# Patient Record
Sex: Female | Born: 1970 | Race: White | Hispanic: No | Marital: Married | Smoking: Never smoker
Health system: Southern US, Community
[De-identification: ages and names within clinical notes are randomized; demographics above are authoritative.]

## PROBLEM LIST (undated history)

## (undated) DIAGNOSIS — F259 Schizoaffective disorder, unspecified: Secondary | ICD-10-CM

## (undated) DIAGNOSIS — C4491 Basal cell carcinoma of skin, unspecified: Secondary | ICD-10-CM

## (undated) DIAGNOSIS — C801 Malignant (primary) neoplasm, unspecified: Secondary | ICD-10-CM

## (undated) DIAGNOSIS — G43909 Migraine, unspecified, not intractable, without status migrainosus: Secondary | ICD-10-CM

## (undated) HISTORY — PX: BASAL CELL CARCINOMA EXCISION: SHX1214

## (undated) HISTORY — DX: Basal cell carcinoma of skin, unspecified: C44.91

## (undated) HISTORY — DX: Migraine, unspecified, not intractable, without status migrainosus: G43.909

---

## 2010-01-19 HISTORY — PX: MOHS SURGERY: SHX181

## 2011-05-26 ENCOUNTER — Ambulatory Visit: Payer: Self-pay | Admitting: Internal Medicine

## 2011-05-28 LAB — HM MAMMOGRAPHY: HM MAMMO: NORMAL

## 2011-09-20 LAB — HM PAP SMEAR: HM Pap smear: NORMAL

## 2014-07-16 ENCOUNTER — Ambulatory Visit (INDEPENDENT_AMBULATORY_CARE_PROVIDER_SITE_OTHER): Payer: BLUE CROSS/BLUE SHIELD | Admitting: Internal Medicine

## 2014-07-16 ENCOUNTER — Encounter (INDEPENDENT_AMBULATORY_CARE_PROVIDER_SITE_OTHER): Payer: Self-pay

## 2014-07-16 ENCOUNTER — Encounter: Payer: Self-pay | Admitting: Internal Medicine

## 2014-07-16 VITALS — BP 110/70 | HR 72 | Temp 98.4°F | Ht 69.0 in | Wt 210.8 lb

## 2014-07-16 DIAGNOSIS — N760 Acute vaginitis: Secondary | ICD-10-CM | POA: Insufficient documentation

## 2014-07-16 DIAGNOSIS — H6502 Acute serous otitis media, left ear: Secondary | ICD-10-CM | POA: Diagnosis not present

## 2014-07-16 DIAGNOSIS — F259 Schizoaffective disorder, unspecified: Secondary | ICD-10-CM | POA: Insufficient documentation

## 2014-07-16 DIAGNOSIS — E785 Hyperlipidemia, unspecified: Secondary | ICD-10-CM | POA: Insufficient documentation

## 2014-07-16 DIAGNOSIS — J40 Bronchitis, not specified as acute or chronic: Secondary | ICD-10-CM | POA: Diagnosis not present

## 2014-07-16 DIAGNOSIS — Z79899 Other long term (current) drug therapy: Secondary | ICD-10-CM | POA: Insufficient documentation

## 2014-07-16 DIAGNOSIS — G43009 Migraine without aura, not intractable, without status migrainosus: Secondary | ICD-10-CM | POA: Insufficient documentation

## 2014-07-16 DIAGNOSIS — F258 Other schizoaffective disorders: Secondary | ICD-10-CM

## 2014-07-16 MED ORDER — AMOXICILLIN-POT CLAVULANATE 875-125 MG PO TABS: 1.0000 | ORAL_TABLET | Freq: Two times a day (BID) | ORAL | Status: DC

## 2014-07-16 NOTE — Patient Instructions (Signed)
Use Nasacort nasal spray and Advil for ear pain May use over the counter cold medication if helpful

## 2014-07-16 NOTE — Progress Notes (Signed)
Date:  07/16/2014   Name:  Beth Lewis   DOB:  [birth year]   MRN:  [id number]   Chief Complaint: Otalgia and Cough Otalgia  There is pain in both ears. This is a new problem. The current episode started 1 to 4 weeks ago. The problem occurs constantly. The problem has been gradually improving. There has been no fever. Associated symptoms include coughing and a sore throat. Pertinent negatives include no ear discharge or headaches. She has tried acetaminophen for the symptoms. The treatment provided mild relief.  Cough Associated symptoms include ear pain and a sore throat. Pertinent negatives include no chest pain, chills, fever, headaches or postnasal drip.     Review of Systems:  Review of Systems  Constitutional: Negative for fever and chills.  HENT: Positive for ear pain and sore throat. Negative for ear discharge, postnasal drip and sinus pressure.   Respiratory: Positive for cough. Negative for chest tightness.   Cardiovascular: Negative for chest pain and palpitations.  Neurological: Negative for headaches.    Patient Active Problem List   Diagnosis Date Noted  . Acute vaginitis 07/16/2014  . Dyslipidemia 07/16/2014  . Polypharmacy 07/16/2014  . Headache, menstrual migraine 07/16/2014  . Chronic schizoaffective disorder 07/16/2014    Prior to Admission medications   Medication Sig Start Date End Date Taking? Authorizing Provider  divalproex (DEPAKOTE ER) 500 MG 24 hr tablet Take 3 tablets by mouth daily. 12/26/13  Yes Historical Provider, MD  norgestrel-ethinyl estradiol (CRYSELLE-28) 0.3-30 MG-MCG tablet Take 1 tablet by mouth daily. 01/08/14  Yes Historical Provider, MD  risperiDONE (RISPERDAL) 1 MG tablet Take 1 tablet by mouth at bedtime. 04/10/14  Yes Historical Provider, MD  zolmitriptan (ZOMIG-ZMT) 5 MG disintegrating tablet Take 1 tablet by mouth daily. 12/16/13  Yes Historical Provider, MD    No Known Allergies  Past Surgical History  Procedure Laterality  Date  . Mohs surgery  2012    History  Substance Use Topics  . Smoking status: Never Smoker   . Smokeless tobacco: Not on file  . Alcohol Use: 0.0 oz/week    0 Standard drinks or equivalent per week     Medication list has been reviewed and updated.  Physical Examination:  Physical Exam  Constitutional: She appears well-developed and well-nourished.  HENT:  Head: Normocephalic.  Right Ear: Hearing and external ear normal. Tympanic membrane is erythematous and retracted.  Left Ear: Hearing normal. Tympanic membrane is erythematous and retracted. A middle ear effusion is present.  Nose: Right sinus exhibits no maxillary sinus tenderness. Left sinus exhibits no maxillary sinus tenderness.  Mouth/Throat: Uvula is midline. Posterior oropharyngeal erythema present. No posterior oropharyngeal edema.  Eyes: Pupils are equal, round, and reactive to light.  Pulmonary/Chest: Effort normal and breath sounds normal. No respiratory distress. She has no wheezes.  Nursing note and vitals reviewed.   BP 110/70 mmHg  Pulse 72  Temp(Src) 98.4 F (36.9 C)  Ht 5\' 9"  (1.753 m)  Wt 210 lb 12.8 oz (95.618 kg)  BMI 31.12 kg/m2  SpO2 98%  Assessment and Plan: 1. Bronchitis Continue Thera-flu if needed - amoxicillin-clavulanate (AUGMENTIN) 875-125 MG per tablet; Take 1 tablet by mouth 2 (two) times daily.  Dispense: 20 tablet; Refill: 0  2. Acute serous otitis media of left ear, recurrence not specified Advil for ear pain - amoxicillin-clavulanate (AUGMENTIN) 875-125 MG per tablet; Take 1 tablet by mouth 2 (two) times daily.  Dispense: 20 tablet; Refill: 0   Halina Maidens, MD [facility]  [facility]  07/16/2014

## 2014-10-01 ENCOUNTER — Other Ambulatory Visit: Payer: Self-pay | Admitting: Internal Medicine

## 2014-11-07 ENCOUNTER — Ambulatory Visit (INDEPENDENT_AMBULATORY_CARE_PROVIDER_SITE_OTHER): Payer: BLUE CROSS/BLUE SHIELD | Admitting: Internal Medicine

## 2014-11-07 ENCOUNTER — Encounter: Payer: Self-pay | Admitting: Internal Medicine

## 2014-11-07 VITALS — BP 100/64 | HR 76 | Ht 69.0 in | Wt 206.6 lb

## 2014-11-07 DIAGNOSIS — B3731 Acute candidiasis of vulva and vagina: Secondary | ICD-10-CM

## 2014-11-07 DIAGNOSIS — R300 Dysuria: Secondary | ICD-10-CM | POA: Diagnosis not present

## 2014-11-07 DIAGNOSIS — B373 Candidiasis of vulva and vagina: Secondary | ICD-10-CM

## 2014-11-07 LAB — POCT URINALYSIS DIPSTICK
BILIRUBIN UA: NEGATIVE
Glucose, UA: NEGATIVE
Ketones, UA: NEGATIVE
NITRITE UA: NEGATIVE
PH UA: 6
Protein, UA: NEGATIVE
Spec Grav, UA: 1.01
UROBILINOGEN UA: 0.2

## 2014-11-07 MED ORDER — CIPROFLOXACIN HCL 250 MG PO TABS: 250.0000 mg | ORAL_TABLET | Freq: Two times a day (BID) | ORAL | Status: DC

## 2014-11-07 MED ORDER — FLUCONAZOLE 100 MG PO TABS: 100.0000 mg | ORAL_TABLET | Freq: Every day | ORAL | Status: DC

## 2014-11-07 NOTE — Progress Notes (Signed)
Date:  11/07/2014   Name:  Beth Lewis   DOB:  [birth year]   MRN:  [id number]   Chief Complaint: Urinary Tract Infection and Vaginitis Patient states she had onset last week of vaginal discharge and itching that began after intercourse. She started using over-the-counter Monistat cream with minimal improvement. I will days ago began to have urinary frequency and urgency as well as mild dysuria. She also had cloudy urine without blood or odor. She denies nausea vomiting fever chills or flank pain. Patient also complains of 2 episodes of menstrual bleeding that occurred midcycle. She is on oral contraceptives and takes them appropriately. She denies any pelvic pain or pain with intercourse. It's been 3 years since her last Pap smear and pelvic exam.  Review of Systems  Constitutional: Negative for fever, chills and fatigue.  Respiratory: Negative for chest tightness.   Cardiovascular: Negative for chest pain.  Gastrointestinal: Negative for nausea and diarrhea.  Genitourinary: Positive for dysuria, frequency, vaginal discharge (itching) and pelvic pain. Negative for hematuria.    Patient Active Problem List   Diagnosis Date Noted  . Acute vaginitis 07/16/2014  . Dyslipidemia 07/16/2014  . Polypharmacy 07/16/2014  . Headache, menstrual migraine 07/16/2014  . Chronic schizoaffective disorder ([place]) 07/16/2014    Prior to Admission medications   Medication Sig Start Date End Date Taking? Authorizing Provider  divalproex (DEPAKOTE ER) 500 MG 24 hr tablet Take 3 tablets by mouth daily. 12/26/13  Yes Historical Provider, MD  norgestrel-ethinyl estradiol (CRYSELLE-28) 0.3-30 MG-MCG tablet Take 1 tablet by mouth daily. 01/08/14  Yes Historical Provider, MD  risperiDONE (RISPERDAL) 1 MG tablet TAKE 1 TABLET BY MOUTH EVERY DAY AT BEDTIME 10/01/14  Yes [name], MD  zolmitriptan (ZOMIG-ZMT) 5 MG disintegrating tablet Take 1 tablet by mouth daily. 12/16/13  Yes Historical Provider, MD     No Known Allergies  Past Surgical History  Procedure Laterality Date  . Mohs surgery  2012    Social History  Substance Use Topics  . Smoking status: Never Smoker   . Smokeless tobacco: None  . Alcohol Use: 0.0 oz/week    0 Standard drinks or equivalent per week     Medication list has been reviewed and updated.   Physical Exam  Constitutional: She appears well-developed.  Cardiovascular: Normal rate and regular rhythm.   Pulmonary/Chest: Effort normal and breath sounds normal.  Abdominal: Soft. Bowel sounds are normal. She exhibits no mass. There is no tenderness. There is no guarding and no CVA tenderness.  Nursing note and vitals reviewed.   BP 100/64 mmHg  Pulse 76  Ht 5\' 9"  (1.753 m)  Wt 206 lb 9.6 oz (93.713 kg)  BMI 30.50 kg/m2  Assessment and Plan: 1. Dysuria Secondary to urinary tract infection - push fluids and empty bladder frequently - POCT urinalysis dipstick - ciprofloxacin (CIPRO) 250 MG tablet; Take 1 tablet (250 mg total) by mouth 2 (two) times daily.  Dispense: 14 tablet; Refill: 0  2. Yeast vaginitis Take Diflucan 1 tablet every other day 3 doses - fluconazole (DIFLUCAN) 100 MG tablet; Take 1 tablet (100 mg total) by mouth daily.  Dispense: 3 tablet; Refill: 0   Halina Maidens, MD [facility]  11/07/2014

## 2014-12-11 LAB — HM PAP SMEAR: HM PAP: NORMAL

## 2014-12-17 ENCOUNTER — Other Ambulatory Visit: Payer: Self-pay | Admitting: Internal Medicine

## 2015-02-05 ENCOUNTER — Other Ambulatory Visit: Payer: Self-pay | Admitting: Internal Medicine

## 2015-02-14 ENCOUNTER — Ambulatory Visit (INDEPENDENT_AMBULATORY_CARE_PROVIDER_SITE_OTHER): Payer: BLUE CROSS/BLUE SHIELD | Admitting: Internal Medicine

## 2015-02-14 ENCOUNTER — Encounter: Payer: Self-pay | Admitting: Internal Medicine

## 2015-02-14 ENCOUNTER — Ambulatory Visit
Admission: RE | Admit: 2015-02-14 | Discharge: 2015-02-14 | Disposition: A | Discharge status: Home / Self Care | Payer: BLUE CROSS/BLUE SHIELD | Source: Ambulatory Visit | Attending: Internal Medicine | Admitting: Internal Medicine

## 2015-02-14 ENCOUNTER — Other Ambulatory Visit: Payer: Self-pay | Admitting: Internal Medicine

## 2015-02-14 VITALS — BP 122/76 | HR 84 | Ht 69.0 in | Wt 205.4 lb

## 2015-02-14 DIAGNOSIS — N912 Amenorrhea, unspecified: Secondary | ICD-10-CM

## 2015-02-14 DIAGNOSIS — Z1231 Encounter for screening mammogram for malignant neoplasm of breast: Secondary | ICD-10-CM

## 2015-02-14 DIAGNOSIS — E785 Hyperlipidemia, unspecified: Secondary | ICD-10-CM

## 2015-02-14 DIAGNOSIS — Z79899 Other long term (current) drug therapy: Secondary | ICD-10-CM | POA: Diagnosis not present

## 2015-02-14 DIAGNOSIS — Z0000 Encounter for general adult medical examination without abnormal findings: Secondary | ICD-10-CM

## 2015-02-14 DIAGNOSIS — F258 Other schizoaffective disorders: Secondary | ICD-10-CM

## 2015-02-14 DIAGNOSIS — F259 Schizoaffective disorder, unspecified: Secondary | ICD-10-CM

## 2015-02-14 LAB — POCT URINALYSIS DIPSTICK
Bilirubin, UA: NEGATIVE
GLUCOSE UA: NEGATIVE
KETONES UA: NEGATIVE
Leukocytes, UA: NEGATIVE
Nitrite, UA: NEGATIVE
PROTEIN UA: NEGATIVE
RBC UA: NEGATIVE
SPEC GRAV UA: 1.015
Urobilinogen, UA: 0.2
pH, UA: 6

## 2015-02-14 NOTE — Progress Notes (Signed)
Date:  02/14/2015   Name:  Beth Lewis   DOB:  16-Jun-1970   MRN:  [id number]   Chief Complaint: Annual Exam Beth Lewis is a 45 y.o. female who presents today for her Complete Annual Exam. She feels fairly well. [facility] She reports she is sleeping fairly well. Seen last fall by GYN - had pap and breast exam. She is due for a mammogram. Amenorrhea - patient is 2 weeks late for her menstrual cycle. Her birth control pills were switched to a lower dose estrogen and a different progesterone; she started the new pack last month. After her period was several days late she did a home pregnancy which was positive, she did 2 more home tests which were both negative. She is concerned because she's also had nausea without vomiting. Migraine - migraine headaches are stable in frequency and severity. Last migraine was about 2-3 months ago. The headaches still respond to medication.  Schizoaffective disorder - she is doing well on medication; risperidone and valproate. Mood has been stable and she's had no recurrence of psychotic symptoms. She occasionally feels anxious this is usually due to a social stress or event.  Review of Systems  Constitutional: Negative for chills, diaphoresis, fatigue and unexpected weight change.  HENT: Negative for hearing loss, tinnitus and trouble swallowing.   Eyes: Negative for visual disturbance.  Respiratory: Negative for shortness of breath and wheezing.   Cardiovascular: Negative for chest pain, palpitations and leg swelling.  Gastrointestinal: Positive for nausea. Negative for vomiting.  Genitourinary: Negative for dysuria, hematuria and menstrual problem.  Musculoskeletal: Negative for myalgias, arthralgias and gait problem.  Skin: Negative for color change and rash.  Neurological: Positive for headaches (last migraine 2 months ago). Negative for syncope.  Psychiatric/Behavioral: Negative for hallucinations,  confusion, sleep disturbance, dysphoric mood and decreased concentration. The patient is not nervous/anxious.     Patient Active Problem List   Diagnosis Date Noted  . Acute vaginitis 07/16/2014  . Dyslipidemia 07/16/2014  . Polypharmacy 07/16/2014  . Headache, menstrual migraine 07/16/2014  . Chronic schizoaffective disorder ([place]) 07/16/2014    Prior to Admission medications   Medication Sig Start Date End Date Taking? Authorizing Provider  divalproex (DEPAKOTE ER) 500 MG 24 hr tablet TAKE 3 TABLETS BY MOUTH EVERY DAY 02/05/15  Yes [name], MD  LO LOESTRIN FE 1 MG-10 MCG / 10 MCG tablet Take 1 tablet by mouth daily. 01/23/15  Yes Historical Provider, MD  risperiDONE (RISPERDAL) 1 MG tablet TAKE 1 TABLET BY MOUTH EVERY DAY AT BEDTIME 10/01/14  Yes [name], MD  zolmitriptan (ZOMIG-ZMT) 5 MG disintegrating tablet Take 1 tablet by mouth daily. 12/16/13  Yes Historical Provider, MD    No Known Allergies  Past Surgical History  Procedure Laterality Date  . Mohs surgery  2012    Social History  Substance Use Topics  . Smoking status: Never Smoker   . Smokeless tobacco: None  . Alcohol Use: 0.0 oz/week    0 Standard drinks or equivalent per week     Comment: occasional    Medication list has been reviewed and updated.   Physical Exam  Constitutional: She is oriented to person, place, and time. She appears well-developed and well-nourished. No distress.  HENT:  Head: Normocephalic and atraumatic.  Right Ear: Tympanic membrane and ear canal normal.  Left Ear: Tympanic membrane and ear canal normal.  Nose: Right sinus exhibits no maxillary  sinus tenderness. Left sinus exhibits no maxillary sinus tenderness.  Mouth/Throat: Uvula is midline and oropharynx is clear and moist.  Eyes: Conjunctivae and EOM are normal. Right eye exhibits no discharge. Left eye exhibits no discharge. No scleral icterus.  Neck: Normal range of motion. Carotid bruit is not present. No  erythema present. No thyromegaly present.  Cardiovascular: Normal rate, regular rhythm, normal heart sounds and normal pulses.   Pulmonary/Chest: Effort normal. No respiratory distress. She has no wheezes.  Abdominal: Soft. Bowel sounds are normal.  Musculoskeletal: Normal range of motion.  Lymphadenopathy:    She has no cervical adenopathy.  Neurological: She is alert and oriented to person, place, and time. She has normal reflexes. No cranial nerve deficit or sensory deficit.  Skin: Skin is warm, dry and intact. No rash noted.  Psychiatric: She has a normal mood and affect. Her speech is normal and behavior is normal. Thought content normal.  Nursing note and vitals reviewed.   BP 122/76 mmHg  Pulse 84  Ht 5\' 9"  (1.753 m)  Wt 205 lb 6.4 oz (93.169 kg)  BMI 30.32 kg/m2  LMP 12/18/2014  Assessment and Plan: 1. Annual physical exam Pap done by GYN Pt encouraged to schedule mammogram - POCT Urinalysis Dipstick - CBC with Differential/Platelet - Comprehensive metabolic panel - TSH  2. Dyslipidemia - Lipid panel  3. Chronic schizoaffective disorder (HCC) Stable on Risperdone and Depakote  4. Amenorrhea Likely due to change in OC formulation If nausea and amenorrhea continue patient instructed to contact GYN - hCG, serum, qualitative  5. Long term use of drug - Valproic Acid level   Halina Maidens, MD [facility]  02/14/2015

## 2015-02-14 NOTE — Patient Instructions (Signed)
Breast Self-Awareness Practicing breast self-awareness may pick up problems early, prevent significant medical complications, and possibly save your life. By practicing breast self-awareness, you can become familiar with how your breasts look and feel and if your breasts are changing. This allows you to notice changes early. It can also offer you some reassurance that your breast health is good. One way to learn what is normal for your breasts and whether your breasts are changing is to do a breast self-exam. If you find a lump or something that was not present in the past, it is best to contact your caregiver right away. Other findings that should be evaluated by your caregiver include nipple discharge, especially if it is bloody; skin changes or reddening; areas where the skin seems to be pulled in (retracted); or new lumps and bumps. Breast pain is seldom associated with cancer (malignancy), but should also be evaluated by a caregiver. HOW TO PERFORM A BREAST SELF-EXAM The best time to examine your breasts is 5-7 days after your menstrual period is over. During menstruation, the breasts are lumpier, and it may be more difficult to pick up changes. If you do not menstruate, have reached menopause, or had your uterus removed (hysterectomy), you should examine your breasts at regular intervals, such as monthly. If you are breastfeeding, examine your breasts after a feeding or after using a breast pump. Breast implants do not decrease the risk for lumps or tumors, so continue to perform breast self-exams as recommended. Talk to your caregiver about how to determine the difference between the implant and breast tissue. Also, talk about the amount of pressure you should use during the exam. Over time, you will become more familiar with the variations of your breasts and more comfortable with the exam. A breast self-exam requires you to remove all your clothes above the waist. 1. Look at your breasts and nipples.  Stand in front of a mirror in a room with good lighting. With your hands on your hips, push your hands firmly downward. Look for a difference in shape, contour, and size from one breast to the other (asymmetry). Asymmetry includes puckers, dips, or bumps. Also, look for skin changes, such as reddened or scaly areas on the breasts. Look for nipple changes, such as discharge, dimpling, repositioning, or redness. 2. Carefully feel your breasts. This is best done either in the shower or tub while using soapy water or when flat on your back. Place the arm (on the side of the breast you are examining) above your head. Use the pads (not the fingertips) of your three middle fingers on your opposite hand to feel your breasts. Start in the underarm area and use  inch (2 cm) overlapping circles to feel your breast. Use 3 different levels of pressure (light, medium, and firm pressure) at each circle before moving to the next circle. The light pressure is needed to feel the tissue closest to the skin. The medium pressure will help to feel breast tissue a little deeper, while the firm pressure is needed to feel the tissue close to the ribs. Continue the overlapping circles, moving downward over the breast until you feel your ribs below your breast. Then, move one finger-width towards the center of the body. Continue to use the  inch (2 cm) overlapping circles to feel your breast as you move slowly up toward the collar bone (clavicle) near the base of the neck. Continue the up and down exam using all 3 pressures until you reach the   middle of the chest. Do this with each breast, carefully feeling for lumps or changes. 3.  Keep a written record with breast changes or normal findings for each breast. By writing this information down, you do not need to depend only on memory for size, tenderness, or location. Write down where you are in your menstrual cycle, if you are still menstruating. Breast tissue can have some lumps or  thick tissue. However, see your caregiver if you find anything that concerns you.  SEEK MEDICAL CARE IF:  You see a change in shape, contour, or size of your breasts or nipples.   You see skin changes, such as reddened or scaly areas on the breasts or nipples.   You have an unusual discharge from your nipples.   You feel a new lump or unusually thick areas.    This information is not intended to replace advice given to you by your health care provider. Make sure you discuss any questions you have with your health care provider.   Document Released: 01/05/2005 Document Revised: 12/23/2011 Document Reviewed: 04/22/2011 Elsevier Interactive Patient Education 2016 [employer] Inc[employer]  

## 2015-02-15 ENCOUNTER — Telehealth: Payer: Self-pay

## 2015-02-15 ENCOUNTER — Ambulatory Visit
Admission: RE | Admit: 2015-02-15 | Discharge: 2015-02-15 | Disposition: A | Discharge status: Home / Self Care | Payer: BLUE CROSS/BLUE SHIELD | Source: Ambulatory Visit | Attending: Internal Medicine | Admitting: Internal Medicine

## 2015-02-15 DIAGNOSIS — Z1231 Encounter for screening mammogram for malignant neoplasm of breast: Secondary | ICD-10-CM | POA: Diagnosis present

## 2015-02-15 HISTORY — DX: Malignant (primary) neoplasm, unspecified: C80.1

## 2015-02-15 LAB — CBC WITH DIFFERENTIAL/PLATELET
BASOS ABS: 0 10*3/uL (ref 0.0–0.2)
Basos: 0 %
EOS (ABSOLUTE): 0.1 10*3/uL (ref 0.0–0.4)
Eos: 1 %
Hematocrit: 42.5 % (ref 34.0–46.6)
Hemoglobin: 14.5 g/dL (ref 11.1–15.9)
IMMATURE GRANS (ABS): 0 10*3/uL (ref 0.0–0.1)
Immature Granulocytes: 0 %
LYMPHS ABS: 2.8 10*3/uL (ref 0.7–3.1)
LYMPHS: 31 %
MCH: 31.7 pg (ref 26.6–33.0)
MCHC: 34.1 g/dL (ref 31.5–35.7)
MCV: 93 fL (ref 79–97)
Monocytes Absolute: 0.4 10*3/uL (ref 0.1–0.9)
Monocytes: 5 %
NEUTROS ABS: 5.6 10*3/uL (ref 1.4–7.0)
Neutrophils: 63 %
PLATELETS: 363 10*3/uL (ref 150–379)
RBC: 4.57 x10E6/uL (ref 3.77–5.28)
RDW: 13.2 % (ref 12.3–15.4)
WBC: 8.9 10*3/uL (ref 3.4–10.8)

## 2015-02-15 LAB — COMPREHENSIVE METABOLIC PANEL
ALK PHOS: 79 IU/L (ref 39–117)
ALT: 15 IU/L (ref 0–32)
AST: 15 IU/L (ref 0–40)
Albumin/Globulin Ratio: 1.6 (ref 1.1–2.5)
Albumin: 4.5 g/dL (ref 3.5–5.5)
BILIRUBIN TOTAL: 0.4 mg/dL (ref 0.0–1.2)
BUN/Creatinine Ratio: 17 (ref 9–23)
BUN: 12 mg/dL (ref 6–24)
CHLORIDE: 96 mmol/L (ref 96–106)
CO2: 20 mmol/L (ref 18–29)
Calcium: 9.8 mg/dL (ref 8.7–10.2)
Creatinine, Ser: 0.72 mg/dL (ref 0.57–1.00)
GFR calc Af Amer: 119 mL/min/{1.73_m2} (ref >59)
GFR calc non Af Amer: 103 mL/min/{1.73_m2} (ref >59)
GLUCOSE: 74 mg/dL (ref 65–99)
Globulin, Total: 2.9 g/dL (ref 1.5–4.5)
Potassium: 4.5 mmol/L (ref 3.5–5.2)
Sodium: 137 mmol/L (ref 134–144)
Total Protein: 7.4 g/dL (ref 6.0–8.5)

## 2015-02-15 LAB — VALPROIC ACID LEVEL: Valproic Acid Lvl: 41 ug/mL — ABNORMAL LOW (ref 50–100)

## 2015-02-15 LAB — LIPID PANEL
CHOLESTEROL TOTAL: 176 mg/dL (ref 100–199)
Chol/HDL Ratio: 3.6 ratio units (ref 0.0–4.4)
HDL: 49 mg/dL (ref >39)
LDL Calculated: 101 mg/dL — ABNORMAL HIGH (ref 0–99)
Triglycerides: 129 mg/dL (ref 0–149)
VLDL CHOLESTEROL CAL: 26 mg/dL (ref 5–40)

## 2015-02-15 LAB — TSH: TSH: 2.63 u[IU]/mL (ref 0.450–4.500)

## 2015-02-15 LAB — HCG, SERUM, QUALITATIVE: HCG, BETA SUBUNIT, QUAL, SERUM: NEGATIVE m[IU]/mL (ref <6)

## 2015-02-15 NOTE — Telephone Encounter (Signed)
Spoke with patient. Patient advised of all results and verbalized understanding. Will call back with any future questions or concerns. MAH  

## 2015-02-15 NOTE — Telephone Encounter (Signed)
-----   Message from [name], MD sent at 02/15/2015  7:49 AM EST ----- Pregnancy test is negative.  Other labs are normal.

## 2015-03-17 ENCOUNTER — Other Ambulatory Visit: Payer: Self-pay | Admitting: Internal Medicine

## 2015-04-02 ENCOUNTER — Ambulatory Visit (INDEPENDENT_AMBULATORY_CARE_PROVIDER_SITE_OTHER): Payer: BLUE CROSS/BLUE SHIELD | Admitting: Internal Medicine

## 2015-04-02 ENCOUNTER — Encounter: Payer: Self-pay | Admitting: Internal Medicine

## 2015-04-02 ENCOUNTER — Other Ambulatory Visit: Payer: Self-pay | Admitting: Internal Medicine

## 2015-04-02 VITALS — BP 124/78 | HR 98 | Temp 97.7°F | Ht 69.0 in | Wt 207.0 lb

## 2015-04-02 DIAGNOSIS — J40 Bronchitis, not specified as acute or chronic: Secondary | ICD-10-CM | POA: Diagnosis not present

## 2015-04-02 MED ORDER — LEVOFLOXACIN 500 MG PO TABS: 500.0000 mg | ORAL_TABLET | Freq: Every day | ORAL | Status: DC

## 2015-04-02 NOTE — Patient Instructions (Signed)

## 2015-04-02 NOTE — Progress Notes (Signed)
    Date:  04/02/2015   Name:  Beth Lewis   DOB:  1970-08-24   MRN:  [id number]   Chief Complaint: Cough Cough This is a new problem. The current episode started 1 to 4 weeks ago. The problem has been unchanged. The problem occurs hourly. The cough is productive of sputum (mostly yellow in the AM). Associated symptoms include chills and shortness of breath. Pertinent negatives include no chest pain, ear pain, fever, headaches, heartburn, postnasal drip, sore throat or wheezing. She has tried OTC cough suppressant for the symptoms.    Review of Systems  Constitutional: Positive for chills. Negative for fever and fatigue.  HENT: Negative for ear pain, hearing loss, postnasal drip and sore throat.   Respiratory: Positive for cough and shortness of breath. Negative for choking, chest tightness and wheezing.   Cardiovascular: Negative for chest pain and palpitations.  Gastrointestinal: Negative for heartburn, nausea, vomiting and abdominal pain.  Neurological: Negative for dizziness, syncope and headaches.    Patient Active Problem List   Diagnosis Date Noted  . Dyslipidemia 07/16/2014  . Headache, menstrual migraine 07/16/2014  . Chronic schizoaffective disorder ([place]) 07/16/2014    Prior to Admission medications   Medication Sig Start Date End Date Taking? Authorizing Provider  divalproex (DEPAKOTE ER) 500 MG 24 hr tablet TAKE 3 TABLETS BY MOUTH EVERY DAY 02/05/15  Yes [name], MD  JUNEL FE 1/20 1-20 MG-MCG tablet Take 1 tablet by mouth daily. 03/31/15  Yes Historical Provider, MD  risperiDONE (RISPERDAL) 1 MG tablet TAKE 1 TABLET BY MOUTH EVERY DAY AT BEDTIME 03/17/15  Yes [name], MD  zolmitriptan (ZOMIG-ZMT) 5 MG disintegrating tablet Take 1 tablet by mouth daily. 12/16/13  Yes Historical Provider, MD    No Known Allergies  Past Surgical History  Procedure Laterality Date  . Mohs surgery  2012    Social History  Substance Use Topics  . Smoking status:  Never Smoker   . Smokeless tobacco: None  . Alcohol Use: 0.0 oz/week    0 Standard drinks or equivalent per week     Comment: occasional    Medication list has been reviewed and updated.   Physical Exam  Constitutional: She is oriented to person, place, and time. She appears well-developed. No distress.  HENT:  Head: Normocephalic and atraumatic.  Right Ear: Tympanic membrane and ear canal normal.  Left Ear: Tympanic membrane and ear canal normal.  Mouth/Throat: No posterior oropharyngeal erythema.  Cardiovascular: Normal rate, regular rhythm and normal heart sounds.   Pulmonary/Chest: Effort normal. No respiratory distress. She has decreased breath sounds. She has no wheezes. She has no rales.  Musculoskeletal: Normal range of motion.  Neurological: She is alert and oriented to person, place, and time.  Skin: Skin is warm and dry. No rash noted.  Psychiatric: She has a normal mood and affect. Her behavior is normal. Thought content normal.    BP 124/78 mmHg  Pulse 98  Temp(Src) 97.7 F (36.5 C) (Oral)  Ht 5\' 9"  (1.753 m)  Wt 207 lb (93.895 kg)  BMI 30.55 kg/m2  SpO2 97%  Assessment and Plan: 1. Bronchitis Continue Daquil and fluids - levofloxacin (LEVAQUIN) 500 MG tablet; Take 1 tablet (500 mg total) by mouth daily.  Dispense: 7 tablet; Refill: 0   Halina Maidens, MD [facility]  04/02/2015

## 2015-05-02 ENCOUNTER — Other Ambulatory Visit: Payer: Self-pay | Admitting: Internal Medicine

## 2015-05-23 ENCOUNTER — Ambulatory Visit (INDEPENDENT_AMBULATORY_CARE_PROVIDER_SITE_OTHER): Payer: BLUE CROSS/BLUE SHIELD | Admitting: Internal Medicine

## 2015-05-23 ENCOUNTER — Encounter: Payer: Self-pay | Admitting: Internal Medicine

## 2015-05-23 VITALS — BP 118/70 | HR 70 | Temp 97.6°F | Wt 206.0 lb

## 2015-05-23 DIAGNOSIS — G43009 Migraine without aura, not intractable, without status migrainosus: Secondary | ICD-10-CM | POA: Diagnosis not present

## 2015-05-23 MED ORDER — NORGESTREL-ETHINYL ESTRADIOL 0.3-30 MG-MCG PO TABS: 1.0000 | ORAL_TABLET | Freq: Every day | ORAL | Status: DC

## 2015-05-23 NOTE — Progress Notes (Signed)
Date:  05/23/2015   Name:  Beth Lewis   DOB:  1970-09-21   MRN:  [id number]   Chief Complaint: Migraine Migraine  This is a recurrent problem. The problem occurs intermittently. The problem has been unchanged. The pain is located in the bilateral region. The pain quality is similar to prior headaches. Associated symptoms include nausea and vomiting. The symptoms are aggravated by OPCs. She has tried triptans for the symptoms.  She been having some spotting with her previous birth control pill-Cryselle. Her GYN changed her to a lower dose pill for the past 2 months she's been having a migraine every other day. Migraine still respond to Zomig but she is not pleased with the side effect.   Review of Systems  Constitutional: Negative for chills and fatigue.  Respiratory: Negative for chest tightness and shortness of breath.   Cardiovascular: Negative for chest pain and leg swelling.  Gastrointestinal: Positive for nausea and vomiting.  Genitourinary: Negative for menstrual problem and pelvic pain.  Neurological: Positive for headaches.    Patient Active Problem List   Diagnosis Date Noted  . Dyslipidemia 07/16/2014  . Headache, menstrual migraine 07/16/2014  . Chronic schizoaffective disorder ([place]) 07/16/2014    Prior to Admission medications   Medication Sig Start Date End Date Taking? Authorizing Provider  divalproex (DEPAKOTE ER) 500 MG 24 hr tablet TAKE 3 TABLETS BY MOUTH EVERY DAY 02/05/15  Yes [name], MD  JUNEL FE 1/20 1-20 MG-MCG tablet Take 1 tablet by mouth daily. 03/31/15  Yes Historical Provider, MD  risperiDONE (RISPERDAL) 1 MG tablet TAKE 1 TABLET BY MOUTH EVERY DAY AT BEDTIME 03/17/15  Yes [name], MD  zolmitriptan (ZOMIG-ZMT) 5 MG disintegrating tablet TAKE 1 TABLET BY MOUTH DAILY AS NEEDED 05/02/15  Yes [name], MD    No Known Allergies  Past Surgical History  Procedure Laterality Date  . Mohs surgery  2012    Social History    Substance Use Topics  . Smoking status: Never Smoker   . Smokeless tobacco: Never Used  . Alcohol Use: 0.0 oz/week    0 Standard drinks or equivalent per week     Comment: occasional     Medication list has been reviewed and updated.   Physical Exam  Constitutional: She is oriented to person, place, and time. She appears well-developed. No distress.  HENT:  Head: Normocephalic and atraumatic.  Right Ear: Tympanic membrane and ear canal normal.  Left Ear: Tympanic membrane and ear canal normal.  Mouth/Throat: Oropharynx is clear and moist.  Eyes: EOM are normal. Pupils are equal, round, and reactive to light.  Neck: Normal range of motion.  Cardiovascular: Normal rate, regular rhythm and normal heart sounds.   Pulmonary/Chest: Effort normal and breath sounds normal. No respiratory distress.  Musculoskeletal: She exhibits no edema.  Neurological: She is alert and oriented to person, place, and time. She has normal reflexes. Gait normal.  Reflex Scores:      Patellar reflexes are 2+ on the right side and 2+ on the left side. Skin: Skin is warm and dry. No rash noted.  Psychiatric: She has a normal mood and affect. Her behavior is normal. Thought content normal.  Nursing note and vitals reviewed.   BP 118/70 mmHg  Pulse 70  Temp(Src) 97.6 F (36.4 C)  Wt 206 lb (93.441 kg)  SpO2 99%  LMP 05/09/2015 (Approximate)  Assessment and Plan: 1. Migraine without aura and responsive to treatment Will stop new  OCP and resume Cryselle If headaches continue, consider starting a preventative therapy   Halina Maidens, MD [facility]  05/23/2015

## 2015-06-12 ENCOUNTER — Other Ambulatory Visit: Payer: Self-pay | Admitting: Internal Medicine

## 2015-09-07 ENCOUNTER — Other Ambulatory Visit: Payer: Self-pay | Admitting: Internal Medicine

## 2015-09-11 ENCOUNTER — Encounter: Payer: Self-pay | Admitting: Internal Medicine

## 2015-09-11 ENCOUNTER — Ambulatory Visit (INDEPENDENT_AMBULATORY_CARE_PROVIDER_SITE_OTHER): Payer: BLUE CROSS/BLUE SHIELD | Admitting: Internal Medicine

## 2015-09-11 VITALS — BP 107/76 | HR 76 | Temp 98.6°F | Resp 16 | Ht 69.0 in | Wt 207.2 lb

## 2015-09-11 DIAGNOSIS — B9689 Other specified bacterial agents as the cause of diseases classified elsewhere: Secondary | ICD-10-CM

## 2015-09-11 DIAGNOSIS — N3000 Acute cystitis without hematuria: Secondary | ICD-10-CM

## 2015-09-11 DIAGNOSIS — N760 Acute vaginitis: Secondary | ICD-10-CM | POA: Diagnosis not present

## 2015-09-11 DIAGNOSIS — A499 Bacterial infection, unspecified: Secondary | ICD-10-CM

## 2015-09-11 LAB — POC URINALYSIS WITH MICROSCOPIC (NON AUTO)MANUAL RESULT
BILIRUBIN UA: NEGATIVE
Crystals: 0
Epithelial cells, urine per micros: 5
GLUCOSE UA: NEGATIVE
KETONES UA: NEGATIVE
Mucus, UA: 0
NITRITE UA: POSITIVE
PROTEIN UA: NEGATIVE
RBC: 15 M/uL — AB (ref 4.04–5.48)
pH, UA: 5

## 2015-09-11 MED ORDER — CIPROFLOXACIN HCL 250 MG PO TABS: 250.0000 mg | ORAL_TABLET | Freq: Two times a day (BID) | ORAL | 0 refills | Status: DC

## 2015-09-11 MED ORDER — METRONIDAZOLE 500 MG PO TABS: 500.0000 mg | ORAL_TABLET | Freq: Two times a day (BID) | ORAL | 0 refills | Status: DC

## 2015-09-11 NOTE — Progress Notes (Signed)
Date:  09/11/2015   Name:  Beth Lewis   DOB:  April 02, 1970   MRN:  [id number]   Chief Complaint: Urinary Frequency (Has had burning all day long starting 2 weeks ago off and on. )  Urinary Frequency   This is a new problem. The current episode started in the past 7 days. The problem occurs every urination. The problem has been unchanged. The quality of the pain is described as burning. The pain is mild. There has been no fever. She is sexually active. There is no history of pyelonephritis. Associated symptoms include frequency. Pertinent negatives include no chills, nausea or vomiting. She has tried increased fluids for the symptoms. The treatment provided mild relief.   Vaginosis - also has vaginal discharge and itching, along with the urinary frequency.   Review of Systems  Constitutional: Negative for chills, fatigue and fever.  Respiratory: Negative for cough, chest tightness and shortness of breath.   Cardiovascular: Negative for chest pain and leg swelling.  Gastrointestinal: Negative for abdominal pain, constipation, nausea and vomiting.  Genitourinary: Positive for frequency and vaginal discharge. Negative for difficulty urinating and pelvic pain.    Patient Active Problem List   Diagnosis Date Noted  . Dyslipidemia 07/16/2014  . Migraine without aura and responsive to treatment 07/16/2014  . Chronic schizoaffective disorder ([place]) 07/16/2014    Prior to Admission medications   Medication Sig Start Date End Date Taking? Authorizing Provider  divalproex (DEPAKOTE ER) 500 MG 24 hr tablet TAKE 3 TABLETS BY MOUTH EVERY DAY 06/12/15  Yes [name], MD  norgestrel-ethinyl estradiol (LO/OVRAL,CRYSELLE) 0.3-30 MG-MCG tablet Take 1 tablet by mouth daily. 05/23/15  Yes [name], MD  risperiDONE (RISPERDAL) 1 MG tablet TAKE 1 TABLET BY MOUTH EVERY DAY AT BEDTIME 09/08/15  Yes [name], MD  zolmitriptan (ZOMIG-ZMT) 5 MG disintegrating tablet TAKE 1 TABLET BY  MOUTH DAILY AS NEEDED 05/02/15  Yes [name], MD    No Known Allergies  Past Surgical History:  Procedure Laterality Date  . MOHS SURGERY  2012    Social History  Substance Use Topics  . Smoking status: Never Smoker  . Smokeless tobacco: Never Used  . Alcohol use 0.0 oz/week     Comment: occasional     Medication list has been reviewed and updated.   Physical Exam  Constitutional: She is oriented to person, place, and time. She appears well-developed and well-nourished. No distress.  HENT:  Head: Normocephalic and atraumatic.  Cardiovascular: Normal rate, regular rhythm and normal heart sounds.   Pulmonary/Chest: Effort normal and breath sounds normal. No respiratory distress.  Abdominal: Soft. Bowel sounds are normal. There is tenderness in the suprapubic area. There is no rebound, no guarding and no CVA tenderness.  Musculoskeletal: Normal range of motion.  Neurological: She is alert and oriented to person, place, and time.  Skin: Skin is warm and dry. No rash noted.  Psychiatric: She has a normal mood and affect. Her behavior is normal. Thought content normal.  Nursing note and vitals reviewed.   BP 107/76 (BP Location: Right Arm, Patient Position: Sitting, Cuff Size: Normal)   Pulse 76   Temp 98.6 F (37 C) (Oral)   Resp 16   Ht 5\' 9"  (1.753 m)   Wt 207 lb 3.2 oz (94 kg)   SpO2 99%   BMI 30.60 kg/m   Assessment and Plan: 1. Acute cystitis without hematuria Continue increased fluids - POC urinalysis w microscopic (non auto) -  ciprofloxacin (CIPRO) 250 MG tablet; Take 1 tablet (250 mg total) by mouth 2 (two) times daily.  Dispense: 6 tablet; Refill: 0  2. BV (bacterial vaginosis) Begin daily Probiotic - metroNIDAZOLE (FLAGYL) 500 MG tablet; Take 1 tablet (500 mg total) by mouth 2 (two) times daily.  Dispense: 14 tablet; Refill: 0   Halina Maidens, MD [facility]  09/11/2015

## 2015-09-17 DIAGNOSIS — R300 Dysuria: Secondary | ICD-10-CM | POA: Diagnosis not present

## 2015-09-17 DIAGNOSIS — R350 Frequency of micturition: Secondary | ICD-10-CM | POA: Diagnosis not present

## 2015-09-17 DIAGNOSIS — N898 Other specified noninflammatory disorders of vagina: Secondary | ICD-10-CM | POA: Diagnosis not present

## 2015-09-17 DIAGNOSIS — N760 Acute vaginitis: Secondary | ICD-10-CM | POA: Diagnosis not present

## 2015-10-06 ENCOUNTER — Other Ambulatory Visit: Payer: Self-pay | Admitting: Internal Medicine

## 2015-11-13 DIAGNOSIS — Z85828 Personal history of other malignant neoplasm of skin: Secondary | ICD-10-CM | POA: Diagnosis not present

## 2016-02-20 ENCOUNTER — Ambulatory Visit (INDEPENDENT_AMBULATORY_CARE_PROVIDER_SITE_OTHER): Payer: BLUE CROSS/BLUE SHIELD | Admitting: Internal Medicine

## 2016-02-20 ENCOUNTER — Encounter: Payer: Self-pay | Admitting: Internal Medicine

## 2016-02-20 VITALS — BP 118/80 | HR 74 | Temp 97.6°F | Ht 69.0 in | Wt 214.0 lb

## 2016-02-20 DIAGNOSIS — Z79899 Other long term (current) drug therapy: Secondary | ICD-10-CM | POA: Diagnosis not present

## 2016-02-20 DIAGNOSIS — E785 Hyperlipidemia, unspecified: Secondary | ICD-10-CM | POA: Diagnosis not present

## 2016-02-20 DIAGNOSIS — Z0000 Encounter for general adult medical examination without abnormal findings: Secondary | ICD-10-CM

## 2016-02-20 DIAGNOSIS — Z23 Encounter for immunization: Secondary | ICD-10-CM

## 2016-02-20 DIAGNOSIS — S86911A Strain of unspecified muscle(s) and tendon(s) at lower leg level, right leg, initial encounter: Secondary | ICD-10-CM

## 2016-02-20 DIAGNOSIS — F259 Schizoaffective disorder, unspecified: Secondary | ICD-10-CM

## 2016-02-20 DIAGNOSIS — G43009 Migraine without aura, not intractable, without status migrainosus: Secondary | ICD-10-CM

## 2016-02-20 DIAGNOSIS — F258 Other schizoaffective disorders: Secondary | ICD-10-CM | POA: Diagnosis not present

## 2016-02-20 DIAGNOSIS — Z1231 Encounter for screening mammogram for malignant neoplasm of breast: Secondary | ICD-10-CM

## 2016-02-20 DIAGNOSIS — Z1239 Encounter for other screening for malignant neoplasm of breast: Secondary | ICD-10-CM

## 2016-02-20 HISTORY — DX: Strain of unspecified muscle(s) and tendon(s) at lower leg level, right leg, initial encounter: S86.911A

## 2016-02-20 LAB — POCT URINALYSIS DIPSTICK
BILIRUBIN UA: NEGATIVE
GLUCOSE UA: NEGATIVE
Leukocytes, UA: NEGATIVE
Nitrite, UA: NEGATIVE
Protein, UA: NEGATIVE
UROBILINOGEN UA: 0.2
pH, UA: 5

## 2016-02-20 MED ORDER — RISPERIDONE 1 MG PO TABS: 1.0000 mg | ORAL_TABLET | Freq: Every day | ORAL | 12 refills | Status: DC

## 2016-02-20 NOTE — Progress Notes (Signed)
Date:  02/20/2016   Name:  Beth Lewis   DOB:  05-Feb-1970   MRN:  [id number]   Chief Complaint: Annual Exam Beth Lewis is a 46 y.o. female who presents today for her Complete Annual Exam. She feels fairly well. She reports exercising regularly. She reports she is sleeping well. She denies breast problems and is due for mammogram.  She had a normal Pap last year.  Migraine   This is a recurrent problem. The problem occurs monthly. The pain quality is similar to prior headaches. The pain is moderate. Pertinent negatives include no abdominal pain, coughing, dizziness, fever, hearing loss, tinnitus or vomiting. She has tried triptans for the symptoms. The treatment provided significant relief.  Knee Pain   The incident occurred 5 to 7 days ago. [place]. The injury mechanism was an inversion injury. The pain is present in the right knee. The symptoms are aggravated by weight bearing. She has tried immobilization and NSAIDs for the symptoms. The treatment provided moderate relief.  Schizoaffective disorder - doing very well with no issues on current therapy.  Feels calm, sleeping well, weight stable.   Review of Systems  Constitutional: Negative for chills, fatigue and fever.  HENT: Negative for congestion, hearing loss, tinnitus, trouble swallowing and voice change.   Eyes: Negative for visual disturbance.  Respiratory: Negative for cough, chest tightness, shortness of breath and wheezing.   Cardiovascular: Negative for chest pain, palpitations and leg swelling.  Gastrointestinal: Negative for abdominal pain, constipation, diarrhea and vomiting.  Endocrine: Negative for polydipsia and polyuria.  Genitourinary: Negative for dysuria, frequency, genital sores, vaginal bleeding and vaginal discharge.  Musculoskeletal: Positive for arthralgias (right knee pain). Negative for gait problem and joint swelling.  Skin: Negative for color change and rash.    Neurological: Negative for dizziness, tremors, light-headedness and headaches.  Hematological: Negative for adenopathy. Does not bruise/bleed easily.  Psychiatric/Behavioral: Negative for dysphoric mood and sleep disturbance. The patient is not nervous/anxious.     Patient Active Problem List   Diagnosis Date Noted  . Dyslipidemia 07/16/2014  . Migraine without aura and responsive to treatment 07/16/2014  . Chronic schizoaffective disorder ([place]) 07/16/2014    Prior to Admission medications   Medication Sig Start Date End Date Taking? Authorizing Provider  divalproex (DEPAKOTE ER) 500 MG 24 hr tablet TAKE 3 TABLETS BY MOUTH EVERY DAY 10/06/15  Yes [name], MD  norgestrel-ethinyl estradiol (LO/OVRAL,CRYSELLE) 0.3-30 MG-MCG tablet Take 1 tablet by mouth daily. 05/23/15  Yes [name], MD  risperiDONE (RISPERDAL) 1 MG tablet TAKE 1 TABLET BY MOUTH EVERY DAY AT BEDTIME 09/08/15  Yes [name], MD  zolmitriptan (ZOMIG-ZMT) 5 MG disintegrating tablet TAKE 1 TABLET BY MOUTH DAILY AS NEEDED 05/02/15  Yes [name], MD    No Known Allergies  Past Surgical History:  Procedure Laterality Date  . MOHS SURGERY  2012    Social History  Substance Use Topics  . Smoking status: Never Smoker  . Smokeless tobacco: Never Used  . Alcohol use 0.0 oz/week     Comment: occasional     Medication list has been reviewed and updated.   Physical Exam  Constitutional: She is oriented to person, place, and time. She appears well-developed and well-nourished. No distress.  HENT:  Head: Normocephalic and atraumatic.  Right Ear: Tympanic membrane and ear canal normal.  Left Ear: Tympanic membrane and ear canal normal.  Nose: Right sinus exhibits no maxillary  sinus tenderness. Left sinus exhibits no maxillary sinus tenderness.  Mouth/Throat: Uvula is midline and oropharynx is clear and moist.  Eyes: Conjunctivae and EOM are normal. Right eye exhibits no discharge. Left eye  exhibits no discharge. No scleral icterus.  Neck: Normal range of motion. Carotid bruit is not present. No erythema present. No thyromegaly present.  Cardiovascular: Normal rate, regular rhythm, normal heart sounds and normal pulses.   Pulmonary/Chest: Effort normal. No respiratory distress. She has no wheezes. Right breast exhibits no mass, no nipple discharge, no skin change and no tenderness. Left breast exhibits no mass, no nipple discharge, no skin change and no tenderness.  Abdominal: Soft. Bowel sounds are normal. There is no hepatosplenomegaly. There is no tenderness. There is no CVA tenderness.  Musculoskeletal: Normal range of motion.       Right knee: Tenderness found.  Brace on right knee  Lymphadenopathy:    She has no cervical adenopathy.    She has no axillary adenopathy.  Neurological: She is alert and oriented to person, place, and time. She has normal reflexes. No cranial nerve deficit or sensory deficit.  Skin: Skin is warm, dry and intact. No rash noted.  Psychiatric: She has a normal mood and affect. Her speech is normal and behavior is normal. Thought content normal.  Nursing note and vitals reviewed.   BP 118/80   Pulse 74   Temp 97.6 F (36.4 C)   Ht 5\' 9"  (1.753 m)   Wt 214 lb (97.1 kg)   SpO2 99%   BMI 31.60 kg/m   Assessment and Plan: 1. Annual physical exam Pap current - TSH - POCT urinalysis dipstick  2. Migraine without aura and responsive to treatment Stable; responsive to triptans  3. Chronic schizoaffective disorder ([facility]) Stable on medication - risperiDONE (RISPERDAL) 1 MG tablet; Take 1 tablet (1 mg total) by mouth at bedtime.  Dispense: 30 tablet; Refill: 12  4. Dyslipidemia - Lipid panel  5. Breast cancer screening Schedule at [place]; Future  6. Long term use of drug - CBC with Differential/Platelet - Comprehensive metabolic panel - Valproic Acid level  7. Knee strain, right, initial  encounter Continue advil and brace Consult Ortho if no improvement in 2-3 weeks  8. Need for diphtheria-tetanus-pertussis (Tdap) vaccine - Tdap vaccine greater than or equal to 7yo IM   Halina Maidens, MD [facility]  02/20/2016

## 2016-02-20 NOTE — Patient Instructions (Addendum)
Tdap Vaccine (Tetanus, Diphtheria and Pertussis): What You Need to Know 1. Why get vaccinated? Tetanus, diphtheria and pertussis are very serious diseases. Tdap vaccine can protect us from these diseases. And, Tdap vaccine given to pregnant women can protect newborn babies against pertussis. TETANUS (Lockjaw) is rare in the [place] today. It causes painful muscle tightening and stiffness, usually all over the body.  It can lead to tightening of muscles in the head and neck so you can't open your mouth, swallow, or sometimes even breathe. Tetanus kills about 1 out of 10 people who are infected even after receiving the best medical care.  DIPHTHERIA is also rare in the [place] today. It can cause a thick coating to form in the back of the throat.  It can lead to breathing problems, heart failure, paralysis, and death.  PERTUSSIS (Whooping Cough) causes severe coughing spells, which can cause difficulty breathing, vomiting and disturbed sleep.  It can also lead to weight loss, incontinence, and rib fractures. Up to 2 in 100 adolescents and 5 in 100 adults with pertussis are hospitalized or have complications, which could include pneumonia or death.  These diseases are caused by bacteria. Diphtheria and pertussis are spread from person to person through secretions from coughing or sneezing. Tetanus enters the body through cuts, scratches, or wounds. Before vaccines, as many as 200,000 cases of diphtheria, 200,000 cases of pertussis, and hundreds of cases of tetanus, were reported in the [place] each year. Since vaccination began, reports of cases for tetanus and diphtheria have dropped by about 99% and for pertussis by about 80%. 2. Tdap vaccine Tdap vaccine can protect adolescents and adults from tetanus, diphtheria, and pertussis. One dose of Tdap is routinely given at age 11 or 12. People who did not get Tdap at that age should get it as soon as possible. Tdap is especially  important for healthcare professionals and anyone having close contact with a baby younger than 12 months. Pregnant women should get a dose of Tdap during every pregnancy, to protect the newborn from pertussis. Infants are most at risk for severe, life-threatening complications from pertussis. Another vaccine, called Td, protects against tetanus and diphtheria, but not pertussis. A Td booster should be given every 10 years. Tdap may be given as one of these boosters if you have never gotten Tdap before. Tdap may also be given after a severe cut or burn to prevent tetanus infection. Your doctor or the person giving you the vaccine can give you more information. Tdap may safely be given at the same time as other vaccines. 3. Some people should not get this vaccine  A person who has ever had a life-threatening allergic reaction after a previous dose of any diphtheria, tetanus or pertussis containing vaccine, OR has a severe allergy to any part of this vaccine, should not get Tdap vaccine. Tell the person giving the vaccine about any severe allergies.  Anyone who had coma or long repeated seizures within 7 days after a childhood dose of DTP or DTaP, or a previous dose of Tdap, should not get Tdap, unless a cause other than the vaccine was found. They can still get Td.  Talk to your doctor if you: ? have seizures or another nervous system problem, ? had severe pain or swelling after any vaccine containing diphtheria, tetanus or pertussis, ? ever had a condition called Guillain-Barr Syndrome (GBS), ? aren't feeling well on the day the shot is scheduled. 4. Risks With any medicine, including   vaccines, there is a chance of side effects. These are usually mild and go away on their own. Serious reactions are also possible but are rare. Most people who get Tdap vaccine do not have any problems with it. Mild problems following Tdap: (Did not interfere with activities)  Pain where the shot was given (about  3 in 4 adolescents or 2 in 3 adults)  Redness or swelling where the shot was given (about 1 person in 5)  Mild fever of at least 100.4F (up to about 1 in 25 adolescents or 1 in 100 adults)  Headache (about 3 or 4 people in 10)  Tiredness (about 1 person in 3 or 4)  Nausea, vomiting, diarrhea, stomach ache (up to 1 in 4 adolescents or 1 in 10 adults)  Chills, sore joints (about 1 person in 10)  Body aches (about 1 person in 3 or 4)  Rash, swollen glands (uncommon)  Moderate problems following Tdap: (Interfered with activities, but did not require medical attention)  Pain where the shot was given (up to 1 in 5 or 6)  Redness or swelling where the shot was given (up to about 1 in 16 adolescents or 1 in 12 adults)  Fever over 102F (about 1 in 100 adolescents or 1 in 250 adults)  Headache (about 1 in 7 adolescents or 1 in 10 adults)  Nausea, vomiting, diarrhea, stomach ache (up to 1 or 3 people in 100)  Swelling of the entire arm where the shot was given (up to about 1 in 500).  Severe problems following Tdap: (Unable to perform usual activities; required medical attention)  Swelling, severe pain, bleeding and redness in the arm where the shot was given (rare).  Problems that could happen after any vaccine:  People sometimes faint after a medical procedure, including vaccination. Sitting or lying down for about 15 minutes can help prevent fainting, and injuries caused by a fall. Tell your doctor if you feel dizzy, or have vision changes or ringing in the ears.  Some people get severe pain in the shoulder and have difficulty moving the arm where a shot was given. This happens very rarely.  Any medication can cause a severe allergic reaction. Such reactions from a vaccine are very rare, estimated at fewer than 1 in a million doses, and would happen within a few minutes to a few hours after the vaccination. As with any medicine, there is a very remote chance of a vaccine  causing a serious injury or death. The safety of vaccines is always being monitored. For more information, visit: www.cdc.gov/vaccinesafety/ 5. What if there is a serious problem? What should I look for? Look for anything that concerns you, such as signs of a severe allergic reaction, very high fever, or unusual behavior. Signs of a severe allergic reaction can include hives, swelling of the face and throat, difficulty breathing, a fast heartbeat, dizziness, and weakness. These would usually start a few minutes to a few hours after the vaccination. What should I do?  If you think it is a severe allergic reaction or other emergency that can't wait, call 9-1-1 or get the person to the nearest hospital. Otherwise, call your doctor.  Afterward, the reaction should be reported to the Vaccine Adverse Event Reporting System (VAERS). Your doctor might file this report, or you can do it yourself through the VAERS web site at www.vaers.hhs.gov, or by calling 1-800-822-7967. ? VAERS does not give medical advice. 6. The National Vaccine Injury Compensation Program The National   Vaccine Injury Compensation Program (VICP) is a federal program that was created to compensate people who may have been injured by certain vaccines. Persons who believe they may have been injured by a vaccine can learn about the program and about filing a claim by calling 1-800-338-2382 or visiting the VICP website at www.hrsa.gov/vaccinecompensation. There is a time limit to file a claim for compensation. 7. How can I learn more?  Ask your doctor. He or she can give you the vaccine package insert or suggest other sources of information.  Call your local or state health department.  Contact the Centers for Disease Control and Prevention (CDC): ? Call 1-800-232-4636 (1-800-CDC-INFO) or ? Visit CDC's website at www.cdc.gov/vaccines CDC Tdap Vaccine VIS (03/14/13) This information is not intended to replace advice given to you by your  health care provider. Make sure you discuss any questions you have with your health care provider. Document Released: 07/07/2011 Document Revised: 09/26/2015 Document Reviewed: 09/26/2015 Elsevier Interactive Patient Education  2017 Elsevier Inc.  

## 2016-02-21 LAB — COMPREHENSIVE METABOLIC PANEL
ALBUMIN: 4.3 g/dL (ref 3.5–5.5)
ALK PHOS: 73 IU/L (ref 39–117)
ALT: 16 IU/L (ref 0–32)
AST: 13 IU/L (ref 0–40)
Albumin/Globulin Ratio: 1.5 (ref 1.2–2.2)
BILIRUBIN TOTAL: 0.3 mg/dL (ref 0.0–1.2)
BUN / CREAT RATIO: 14 (ref 9–23)
BUN: 13 mg/dL (ref 6–24)
CHLORIDE: 100 mmol/L (ref 96–106)
CO2: 22 mmol/L (ref 18–29)
Calcium: 9.9 mg/dL (ref 8.7–10.2)
Creatinine, Ser: 0.92 mg/dL (ref 0.57–1.00)
GFR calc non Af Amer: 76 mL/min/{1.73_m2} (ref >59)
GFR, EST AFRICAN AMERICAN: 88 mL/min/{1.73_m2} (ref >59)
GLUCOSE: 84 mg/dL (ref 65–99)
Globulin, Total: 2.8 g/dL (ref 1.5–4.5)
Potassium: 5.2 mmol/L (ref 3.5–5.2)
Sodium: 139 mmol/L (ref 134–144)
TOTAL PROTEIN: 7.1 g/dL (ref 6.0–8.5)

## 2016-02-21 LAB — LIPID PANEL
Chol/HDL Ratio: 4.2 ratio units (ref 0.0–4.4)
Cholesterol, Total: 182 mg/dL (ref 100–199)
HDL: 43 mg/dL (ref >39)
LDL Calculated: 107 mg/dL — ABNORMAL HIGH (ref 0–99)
Triglycerides: 162 mg/dL — ABNORMAL HIGH (ref 0–149)
VLDL Cholesterol Cal: 32 mg/dL (ref 5–40)

## 2016-02-21 LAB — CBC WITH DIFFERENTIAL/PLATELET
BASOS ABS: 0 10*3/uL (ref 0.0–0.2)
BASOS: 0 %
EOS (ABSOLUTE): 0.1 10*3/uL (ref 0.0–0.4)
Eos: 1 %
Hematocrit: 42.6 % (ref 34.0–46.6)
Hemoglobin: 14.3 g/dL (ref 11.1–15.9)
IMMATURE GRANS (ABS): 0 10*3/uL (ref 0.0–0.1)
Immature Granulocytes: 0 %
LYMPHS ABS: 2.9 10*3/uL (ref 0.7–3.1)
Lymphs: 29 %
MCH: 31.4 pg (ref 26.6–33.0)
MCHC: 33.6 g/dL (ref 31.5–35.7)
MCV: 94 fL (ref 79–97)
Monocytes Absolute: 0.5 10*3/uL (ref 0.1–0.9)
Monocytes: 5 %
NEUTROS ABS: 6.3 10*3/uL (ref 1.4–7.0)
Neutrophils: 65 %
PLATELETS: 340 10*3/uL (ref 150–379)
RBC: 4.55 x10E6/uL (ref 3.77–5.28)
RDW: 13 % (ref 12.3–15.4)
WBC: 9.8 10*3/uL (ref 3.4–10.8)

## 2016-02-21 LAB — TSH: TSH: 2.41 u[IU]/mL (ref 0.450–4.500)

## 2016-02-21 LAB — VALPROIC ACID LEVEL: VALPROIC ACID LVL: 36 ug/mL — AB (ref 50–100)

## 2016-03-02 ENCOUNTER — Ambulatory Visit: Admission: RE | Admit: 2016-03-02 | Payer: BLUE CROSS/BLUE SHIELD | Source: Ambulatory Visit

## 2016-03-05 ENCOUNTER — Ambulatory Visit
Admission: RE | Admit: 2016-03-05 | Discharge: 2016-03-05 | Disposition: A | Discharge status: Home / Self Care | Payer: BLUE CROSS/BLUE SHIELD | Source: Ambulatory Visit | Attending: Internal Medicine | Admitting: Internal Medicine

## 2016-03-05 DIAGNOSIS — Z1231 Encounter for screening mammogram for malignant neoplasm of breast: Secondary | ICD-10-CM | POA: Diagnosis not present

## 2016-03-05 DIAGNOSIS — Z1239 Encounter for other screening for malignant neoplasm of breast: Secondary | ICD-10-CM

## 2016-05-28 ENCOUNTER — Other Ambulatory Visit: Payer: Self-pay | Admitting: Internal Medicine

## 2016-06-10 ENCOUNTER — Encounter: Payer: Self-pay | Admitting: *Deleted

## 2016-06-10 ENCOUNTER — Ambulatory Visit
Admission: EM | Admit: 2016-06-10 | Discharge: 2016-06-10 | Disposition: A | Discharge status: Home / Self Care | Payer: BLUE CROSS/BLUE SHIELD | Attending: Family Medicine | Admitting: Family Medicine

## 2016-06-10 DIAGNOSIS — N3000 Acute cystitis without hematuria: Secondary | ICD-10-CM

## 2016-06-10 HISTORY — DX: Schizoaffective disorder, unspecified: F25.9

## 2016-06-10 LAB — URINALYSIS, COMPLETE (UACMP) WITH MICROSCOPIC
Bilirubin Urine: NEGATIVE
GLUCOSE, UA: NEGATIVE mg/dL
HGB URINE DIPSTICK: NEGATIVE
NITRITE: NEGATIVE
PH: 5.5 (ref 5.0–8.0)
PROTEIN: NEGATIVE mg/dL
RBC / HPF: NONE SEEN RBC/hpf (ref 0–5)
Specific Gravity, Urine: 1.025 (ref 1.005–1.030)

## 2016-06-10 MED ORDER — CIPROFLOXACIN HCL 250 MG PO TABS: 250.0000 mg | ORAL_TABLET | Freq: Two times a day (BID) | ORAL | 0 refills | Status: DC

## 2016-06-10 NOTE — ED Triage Notes (Signed)
Onset of dysuria 1 1/2 weeks ago which has partially resolved, now c/o urinary freq/urg and nausea. Hx of UTIs.

## 2016-06-10 NOTE — ED Provider Notes (Signed)
[facility]    CSN: [id number] Arrival date & time: 06/10/16  9741     History   Chief Complaint Chief Complaint  Patient presents with  . Dysuria  . Urinary Frequency  . Urinary Urgency  . Nausea    HPI Beth Lewis is a 46 y.o. female.    Dysuria  Pain quality:  Burning Pain severity:  Moderate Onset quality:  Sudden Duration:  9 days Timing:  Constant Progression:  Worsening Chronicity:  New Recent urinary tract infections: no   Relieved by:  None tried Ineffective treatments:  None tried Urinary symptoms: frequent urination and hesitancy   Urinary symptoms: no discolored urine, no foul-smelling urine and no bladder incontinence   Associated symptoms: no abdominal pain, no fever, no flank pain, no genital lesions, no nausea, no vaginal discharge and no vomiting   Risk factors: no hx of pyelonephritis, no hx of urolithiasis, no kidney transplant, not pregnant, no recurrent urinary tract infections, no renal cysts, no renal disease, not sexually active, no sexually transmitted infections, no single kidney and no urinary catheter   Urinary Frequency  Pertinent negatives include no abdominal pain.    Past Medical History:  Diagnosis Date  . Cancer ([place])    skin ca  . Schizoaffective disorder [facility])     Patient Active Problem List   Diagnosis Date Noted  . Knee strain, right, initial encounter 02/20/2016  . Dyslipidemia 07/16/2014  . Migraine without aura and responsive to treatment 07/16/2014  . Chronic schizoaffective disorder ([place]) 07/16/2014    Past Surgical History:  Procedure Laterality Date  . MOHS SURGERY  2012    OB History    No data available       Home Medications    Prior to Admission medications   Medication Sig Start Date End Date Taking? Authorizing Provider  divalproex (DEPAKOTE ER) 500 MG 24 hr tablet TAKE 3 TABLETS BY MOUTH EVERY DAY 10/06/15  Yes [name], MD  LOW-OGESTREL 0.3-30 MG-MCG tablet TAKE AS  DIRECTED 05/28/16  Yes [name], MD  risperiDONE (RISPERDAL) 1 MG tablet Take 1 tablet (1 mg total) by mouth at bedtime. 02/20/16  Yes [name], MD  ciprofloxacin (CIPRO) 250 MG tablet Take 1 tablet (250 mg total) by mouth every 12 (twelve) hours. 06/10/16   Norval Gable, MD  zolmitriptan (ZOMIG-ZMT) 5 MG disintegrating tablet TAKE 1 TABLET BY MOUTH DAILY AS NEEDED 05/02/15   [name], MD    Family History Family History  Problem Relation Age of Onset  . Cancer Father        brain  . Breast cancer Cousin 25  . Cancer Maternal Grandmother        ovarian    Social History Social History  Substance Use Topics  . Smoking status: Never Smoker  . Smokeless tobacco: Never Used  . Alcohol use 0.0 oz/week     Comment: occasional     Allergies   Patient has no known allergies.   Review of Systems Review of Systems  Constitutional: Negative for fever.  Gastrointestinal: Negative for abdominal pain, nausea and vomiting.  Genitourinary: Positive for dysuria and frequency. Negative for flank pain and vaginal discharge.     Physical Exam Triage Vital Signs ED Triage Vitals  Enc Vitals Group     BP 06/10/16 1931 127/80     Pulse Rate 06/10/16 1931 83     Resp 06/10/16 1931 16     Temp 06/10/16 1931  98.6 F (37 C)     Temp Source 06/10/16 1931 Oral     SpO2 06/10/16 1931 100 %     Weight 06/10/16 1932 216 lb (98 kg)     Height 06/10/16 1932 5\' 3"  (1.6 m)     Head Circumference --      Peak Flow --      Pain Score --      Pain Loc --      Pain Edu? --      Excl. in [place]? --    No data found.   Updated Vital Signs BP 127/80 (BP Location: Left Arm)   Pulse 83   Temp 98.6 F (37 C) (Oral)   Resp 16   Ht 5\' 3"  (1.6 m)   Wt 216 lb (98 kg)   LMP 05/27/2016   SpO2 100%   BMI 38.26 kg/m   Visual Acuity Right Eye Distance:   Left Eye Distance:   Bilateral Distance:    Right Eye Near:   Left Eye Near:    Bilateral Near:     Physical Exam    Constitutional: She appears well-developed and well-nourished. No distress.  Abdominal: Soft. She exhibits no distension. There is no tenderness.  Neurological: She is alert.  Skin: No rash noted. She is not diaphoretic.  Nursing note and vitals reviewed.    UC Treatments / Results  Labs (all labs ordered are listed, but only abnormal results are displayed) Labs Reviewed  URINALYSIS, COMPLETE (UACMP) WITH MICROSCOPIC - Abnormal; Notable for the following:       Result Value   Ketones, ur TRACE (*)    Leukocytes, UA TRACE (*)    Squamous Epithelial / LPF 6-30 (*)    Bacteria, UA RARE (*)    All other components within normal limits  URINE CULTURE    EKG  EKG Interpretation None       Radiology No results found.  Procedures Procedures (including critical care time)  Medications Ordered in UC Medications - No data to display   Initial Impression / Assessment and Plan / UC Course  I have reviewed the triage vital signs and the nursing notes.  Pertinent labs & imaging results that were available during my care of the patient were reviewed by me and considered in my medical decision making (see chart for details).       Final Clinical Impressions(s) / UC Diagnoses   Final diagnoses:  Acute cystitis without hematuria    New Prescriptions Discharge Medication List as of 06/10/2016  8:18 PM    START taking these medications   Details  ciprofloxacin (CIPRO) 250 MG tablet Take 1 tablet (250 mg total) by mouth every 12 (twelve) hours., Starting Wed 06/10/2016, Normal       1. Lab results and diagnosis reviewed with patient 2. rx as per orders above; reviewed possible side effects, interactions, risks and benefits  3. Recommend supportive treatment with increased water intake 4. Follow-up prn if symptoms worsen or don't improve   Norval Gable, MD 06/10/16 2021

## 2016-07-20 ENCOUNTER — Encounter: Payer: Self-pay | Admitting: Internal Medicine

## 2016-07-20 ENCOUNTER — Ambulatory Visit (INDEPENDENT_AMBULATORY_CARE_PROVIDER_SITE_OTHER): Payer: BLUE CROSS/BLUE SHIELD | Admitting: Internal Medicine

## 2016-07-20 VITALS — BP 108/64 | HR 91 | Ht 69.0 in | Wt 211.0 lb

## 2016-07-20 DIAGNOSIS — N760 Acute vaginitis: Secondary | ICD-10-CM

## 2016-07-20 LAB — POCT URINALYSIS DIPSTICK
Bilirubin, UA: NEGATIVE
Glucose, UA: NEGATIVE
KETONES UA: NEGATIVE
Leukocytes, UA: NEGATIVE
Nitrite, UA: NEGATIVE
PROTEIN UA: NEGATIVE
RBC UA: NEGATIVE
Spec Grav, UA: 1.02 (ref 1.010–1.025)
UROBILINOGEN UA: 0.2 U/dL
pH, UA: 5 (ref 5.0–8.0)

## 2016-07-20 LAB — POCT WET PREP WITH KOH
KOH PREP POC: NEGATIVE
RBC WET PREP PER HPF POC: 0
Trichomonas, UA: NEGATIVE
WBC WET PREP PER HPF POC: 10

## 2016-07-20 MED ORDER — METRONIDAZOLE 500 MG PO TABS: 500.0000 mg | ORAL_TABLET | Freq: Two times a day (BID) | ORAL | 0 refills | Status: DC

## 2016-07-20 NOTE — Progress Notes (Signed)
Date:  07/20/2016   Name:  Beth Lewis   DOB:  02-10-1970   MRN:  [id number]   Chief Complaint: Vaginal Itching (Burning while urination- using flushable wipes to try and keep cleaner. - itching. Tried Monostat OTC and not getting any better. Last used a week ago.  ) Several weeks of burning with urination and possible vaginal discharge but not white or heavy.  She was treated about 6 weeks ago in UC with Cipro - UA reviewed and was not very remarkable.  She seemed to feel a bit better.  Tried monostat vaginal cream with very slight benefit.  No fever or chills, back pain, blood in urine or abnormal vaginal bleeding.   Review of Systems  Constitutional: Negative for chills and fever.  Respiratory: Negative for chest tightness and shortness of breath.   Cardiovascular: Negative for chest pain.  Gastrointestinal: Negative for abdominal pain, diarrhea and vomiting.  Genitourinary: Positive for dysuria and vaginal discharge. Negative for hematuria.    Patient Active Problem List   Diagnosis Date Noted  . Knee strain, right, initial encounter 02/20/2016  . Dyslipidemia 07/16/2014  . Migraine without aura and responsive to treatment 07/16/2014  . Chronic schizoaffective disorder ([place]) 07/16/2014    Prior to Admission medications   Medication Sig Start Date End Date Taking? Authorizing Provider  divalproex (DEPAKOTE ER) 500 MG 24 hr tablet TAKE 3 TABLETS BY MOUTH EVERY DAY 10/06/15  Yes [name], MD  LOW-OGESTREL 0.3-30 MG-MCG tablet TAKE AS DIRECTED 05/28/16  Yes [name], MD  risperiDONE (RISPERDAL) 1 MG tablet Take 1 tablet (1 mg total) by mouth at bedtime. 02/20/16  Yes [name], MD  zolmitriptan (ZOMIG-ZMT) 5 MG disintegrating tablet TAKE 1 TABLET BY MOUTH DAILY AS NEEDED 05/02/15  Yes [name], MD    No Known Allergies  Past Surgical History:  Procedure Laterality Date  . MOHS SURGERY  2012    Social History  Substance Use Topics  .  Smoking status: Never Smoker  . Smokeless tobacco: Never Used  . Alcohol use 0.0 oz/week     Comment: occasional     Medication list has been reviewed and updated.   Physical Exam  Constitutional: She is oriented to person, place, and time. She appears well-developed. No distress.  HENT:  Head: Normocephalic and atraumatic.  Pulmonary/Chest: Effort normal. No respiratory distress.  Genitourinary: There is no tenderness, lesion or injury on the right labia. There is no tenderness, lesion or injury on the left labia. There is erythema in the vagina. Vaginal discharge found.  Musculoskeletal: Normal range of motion.  Neurological: She is alert and oriented to person, place, and time.  Skin: Skin is warm and dry. No rash noted.  Psychiatric: She has a normal mood and affect. Her behavior is normal. Thought content normal.  Nursing note and vitals reviewed.  Microscopic wet-mount exam shows KOH done - positive for clue cells.  Urine dipstick shows negative for all components.  Micro exam: not done.  BP 108/64   Pulse 91   Ht 5\' 9"  (1.753 m)   Wt 211 lb (95.7 kg)   LMP 06/29/2016 (Approximate)   SpO2 97%   BMI 31.16 kg/m   Assessment and Plan: 1. Acute vaginitis - POCT Wet Prep with KOH - POCT urinalysis dipstick   Meds ordered this encounter  Medications  . metroNIDAZOLE (FLAGYL) 500 MG tablet    Sig: Take 1 tablet (500 mg total) by mouth 2 (  two) times daily.    Dispense:  14 tablet    Refill:  0    Halina Maidens, MD [facility]  07/20/2016

## 2016-08-04 ENCOUNTER — Encounter: Payer: Self-pay | Admitting: Obstetrics and Gynecology

## 2016-08-04 ENCOUNTER — Ambulatory Visit: Payer: BLUE CROSS/BLUE SHIELD | Admitting: Obstetrics and Gynecology

## 2016-08-04 VITALS — BP 122/74 | Ht 69.0 in | Wt 211.0 lb

## 2016-08-04 NOTE — Progress Notes (Deleted)
Gynecology Annual Exam  PCP: [name], MD   Chief Complaint  Patient presents with  . Annual Exam    History of Present Illness:  Ms. Beth Lewis is a 46 y.o. G8T1572 who LMP was Patient's last menstrual period was 07/05/2016., presents today for her annual examination.  Her menses are {norm/abn:715}, lasting {number:22536} {duration:315003}.  Dysmenorrhea {dysmen:716}. She {does:18564} have intermenstrual bleeding.  She {does:18564} have vasomotor sx. She uses ***meds.  She is {sex active:315163}. She {does:18564} have vaginal dryness.  Last Pap: {IOMB:559741638}  Results were: {norm/abn:16707::"no abnormalities"} /neg HPV DNA.  Hx of STDs: {STD hx:14358}  Last mammogram: {date:304500300}  Results were: {norm/abn:13465} There is no FH of breast cancer. There is no FH of ovarian cancer. The patient {does:18564} do self-breast exams.  Colonoscopy: {hx:15363} DEXA: {scr:19091}  Tobacco use: {tob:20664} Alcohol use: {Alcohol:11675} Exercise: {exercise:31265}  She {does:18564} get adequate calcium and Vitamin D in her diet. ***  The patient wears seatbelts: {yes/no:63}.     Past Medical History:  Diagnosis Date  . Cancer ([place])    skin ca  . Schizoaffective disorder [facility])     Past Surgical History:  Procedure Laterality Date  . MOHS SURGERY  2012    Prior to Admission medications   Medication Sig Start Date End Date Taking? Authorizing Provider  divalproex (DEPAKOTE ER) 500 MG 24 hr tablet TAKE 3 TABLETS BY MOUTH EVERY DAY 10/06/15  Yes [name], MD  LOW-OGESTREL 0.3-30 MG-MCG tablet TAKE AS DIRECTED 05/28/16  Yes [name], MD  risperiDONE (RISPERDAL) 1 MG tablet Take 1 tablet (1 mg total) by mouth at bedtime. 02/20/16  Yes [name], MD  zolmitriptan (ZOMIG-ZMT) 5 MG disintegrating tablet TAKE 1 TABLET BY MOUTH DAILY AS NEEDED 05/02/15  Yes [name], MD  metroNIDAZOLE (FLAGYL) 500 MG tablet Take 1 tablet (500 mg total) by  mouth 2 (two) times daily. Patient not taking: Reported on 08/04/2016 07/20/16   [name], MD    No Known Allergies  Gynecologic History:  Patient's last menstrual period was 07/05/2016. Contraception: {method:5051} Last Pap: ***. Results were: {norm/abn:16337} Last mammogram: ***. Results were: {norm/abn:16337} History of STI: {YES NO:22349}  Obstetric History: [id number]  Family History  Problem Relation Age of Onset  . Cancer Father        brain  . Breast cancer Cousin 25  . Cancer Maternal Grandmother        ovarian    Social History   Social History  . Marital status: Married    Spouse name: N/A  . Number of children: N/A  . Years of education: N/A   Occupational History  . Not on file.   Social History Main Topics  . Smoking status: Never Smoker  . Smokeless tobacco: Never Used  . Alcohol use 0.0 oz/week     Comment: occasional  . Drug use: No  . Sexual activity: Yes    Birth control/ protection: Pill   Other Topics Concern  . Not on file   Social History Narrative  . No narrative on file    ROS   Physical Exam BP 122/74   Ht 5\' 9"  (1.753 m)   Wt 211 lb (95.7 kg)   LMP 07/05/2016   BMI 31.16 kg/m   OBGyn Exam  Female chaperone present for pelvic and breast  portions of the physical exam  Results: AUDIT Questionnaire (screen for alcoholism): *** PHQ-9: ***  Assessment: 46 y.o. E3O1224 female here for routine gynecologic  examination.  Plan: Problem List Items Addressed This Visit    None      Screening: -- Blood pressure screen {Blank single:19197::"normal","elevated: continued to monitor.","managed by PCP"} -- Colonoscopy - {Blank single:19197::"due - will schedule","due - managed by PCP","not due"} -- Mammogram - {Blank single:19197::"due. Patient to call [place] to arrange. She understands that it is her responsibility to arrange this.","not due","due - already scheduled at [facility] on ***"} -- Weight screening: {Blank  single:19197::"obese: discussed management options, including lifestyle, dietary, and exercise.","overweight: continue to monitor","normal"} -- Depression screening negative (PHQ-9) -- Nutrition: normal -- cholesterol screening: {Blank single:19197::"will obtain","per PCP","not due for screening"} -- osteoporosis screening: {Blank single:19197::"not due","due - will order DEXA"} -- tobacco screening: {Blank single:19197::"using: discussed quitting using the 5 A's","not using"} -- alcohol screening: AUDIT questionnaire indicates low-risk usage. -- family history of breast cancer screening: done. not at high risk. -- no evidence of domestic violence or intimate partner violence. -- STD screening: gonorrhea/chlamydia NAAT {Blank single:19197::"not collected per patient request.","collected"} -- pap smear {Blank single:19197::"collected","not collected"} per ASCCP guidelines -- flu vaccine {Blank single:19197} -- HPV vaccination series: {Blank single:19197::"received","has not received - will orderd","has not received - pt refuses","not eligilbe"}  Prentice Docker, MD 08/04/2016 2:41 PM

## 2016-08-05 NOTE — Progress Notes (Signed)
Patient showed for appointment, but had to leave just prior to being seen.  She states that she will reschedule.

## 2016-09-01 ENCOUNTER — Other Ambulatory Visit: Payer: Self-pay | Admitting: Internal Medicine

## 2016-09-01 ENCOUNTER — Telehealth: Payer: Self-pay

## 2016-09-01 DIAGNOSIS — F258 Other schizoaffective disorders: Principal | ICD-10-CM

## 2016-09-01 DIAGNOSIS — F259 Schizoaffective disorder, unspecified: Secondary | ICD-10-CM

## 2016-09-01 MED ORDER — NORGESTREL-ETHINYL ESTRADIOL 0.3-30 MG-MCG PO TABS: 1.0000 | ORAL_TABLET | Freq: Every day | ORAL | 3 refills | Status: DC

## 2016-09-01 MED ORDER — RISPERIDONE 1 MG PO TABS
1.0000 mg | ORAL_TABLET | Freq: Every day | ORAL | 3 refills | Status: DC
Start: 2016-09-01 — End: 2017-09-07

## 2016-09-01 NOTE — Telephone Encounter (Signed)
Refill requested through FAX from [facility] for patients Low-Ogestrel, and Risperidone. Please Advise.

## 2016-09-01 NOTE — Telephone Encounter (Signed)
Done

## 2016-09-11 ENCOUNTER — Telehealth: Payer: Self-pay

## 2016-09-11 NOTE — Telephone Encounter (Signed)
Patient requesting refill on Divalproex Sod Er tabs 500 mg - 90 day supply sent to [facility]. Please Advise.

## 2016-09-16 ENCOUNTER — Telehealth: Payer: Self-pay

## 2016-09-16 ENCOUNTER — Other Ambulatory Visit: Payer: Self-pay | Admitting: Internal Medicine

## 2016-09-16 MED ORDER — DIVALPROEX SODIUM ER 500 MG PO TB24: 1500.0000 mg | ORAL_TABLET | Freq: Every day | ORAL | 3 refills | Status: DC

## 2016-09-16 NOTE — Telephone Encounter (Signed)
[facility]ax received from [facility] requesting refill on Divalproes Sod ER. Please Advise.

## 2016-09-16 NOTE — Telephone Encounter (Signed)
Done

## 2016-11-11 DIAGNOSIS — D2261 Melanocytic nevi of right upper limb, including shoulder: Secondary | ICD-10-CM | POA: Diagnosis not present

## 2016-11-11 DIAGNOSIS — D485 Neoplasm of uncertain behavior of skin: Secondary | ICD-10-CM | POA: Diagnosis not present

## 2016-11-11 DIAGNOSIS — Z85828 Personal history of other malignant neoplasm of skin: Secondary | ICD-10-CM | POA: Diagnosis not present

## 2016-12-21 ENCOUNTER — Ambulatory Visit (INDEPENDENT_AMBULATORY_CARE_PROVIDER_SITE_OTHER): Payer: BLUE CROSS/BLUE SHIELD | Admitting: Internal Medicine

## 2016-12-21 ENCOUNTER — Encounter: Payer: Self-pay | Admitting: Internal Medicine

## 2016-12-21 VITALS — BP 120/72 | HR 68 | Temp 98.0°F | Ht 69.0 in | Wt 216.0 lb

## 2016-12-21 DIAGNOSIS — N3281 Overactive bladder: Secondary | ICD-10-CM

## 2016-12-21 LAB — POCT URINALYSIS DIPSTICK
BILIRUBIN UA: NEGATIVE
Blood, UA: NEGATIVE
GLUCOSE UA: NEGATIVE
KETONES UA: NEGATIVE
LEUKOCYTES UA: NEGATIVE
Nitrite, UA: NEGATIVE
PROTEIN UA: NEGATIVE
Spec Grav, UA: 1.015 (ref 1.010–1.025)
Urobilinogen, UA: 0.2 E.U./dL
pH, UA: 7.5 (ref 5.0–8.0)

## 2016-12-21 MED ORDER — MIRABEGRON ER 25 MG PO TB24: 25.0000 mg | ORAL_TABLET | Freq: Every day | ORAL | 0 refills | Status: DC

## 2016-12-21 NOTE — Progress Notes (Signed)
Date:  12/21/2016   Name:  Beth Lewis   DOB:  10-07-1970   MRN:  [id number]   Chief Complaint: Urinary Tract Infection (Starting a few months ago- Going to the restroom more frequently- feels like NOT emptying all the way. Feeling like has to go to the bathroom and when gets there cannot go. Has not made it to the bathroom on time once and used the bathroom in pants. Having some pressure. No pain. ) and Immunizations (Flu Shot)  Urinary Frequency   This is a new problem. The current episode started more than 1 month ago. The problem has been gradually worsening. The patient is experiencing no pain. There has been no fever. Associated symptoms include frequency and urgency. Pertinent negatives include no chills, discharge, flank pain, hematuria, nausea or vomiting. She has tried nothing for the symptoms.     Review of Systems  Constitutional: Negative for chills and fatigue.  Respiratory: Negative for chest tightness, shortness of breath and wheezing.   Cardiovascular: Negative for chest pain and palpitations.  Gastrointestinal: Negative for abdominal pain, nausea and vomiting.  Genitourinary: Positive for frequency and urgency. Negative for dysuria, flank pain and hematuria.  Musculoskeletal: Negative for arthralgias.    Patient Active Problem List   Diagnosis Date Noted  . Knee strain, right, initial encounter 02/20/2016  . Dyslipidemia 07/16/2014  . Migraine without aura and responsive to treatment 07/16/2014  . Chronic schizoaffective disorder ([place]) 07/16/2014    Prior to Admission medications   Medication Sig Start Date End Date Taking? Authorizing Provider  divalproex (DEPAKOTE ER) 500 MG 24 hr tablet Take 3 tablets (1,500 mg total) by mouth daily. 09/16/16  Yes [name], MD  norgestrel-ethinyl estradiol (LOW-OGESTREL) 0.3-30 MG-MCG tablet Take 1 tablet by mouth daily. 09/01/16  Yes [name], MD  risperiDONE (RISPERDAL) 1 MG tablet Take 1 tablet (1 mg  total) by mouth at bedtime. 09/01/16  Yes [name], MD  zolmitriptan (ZOMIG-ZMT) 5 MG disintegrating tablet TAKE 1 TABLET BY MOUTH DAILY AS NEEDED 05/02/15  Yes [name], MD  mirabegron ER (MYRBETRIQ) 25 MG TB24 tablet Take 1 tablet (25 mg total) by mouth daily. 12/21/16   [name], MD    No Known Allergies  Past Surgical History:  Procedure Laterality Date  . MOHS SURGERY  2012    Social History   Tobacco Use  . Smoking status: Never Smoker  . Smokeless tobacco: Never Used  Substance Use Topics  . Alcohol use: Yes    Alcohol/week: 0.0 oz    Comment: occasional  . Drug use: No     Medication list has been reviewed and updated.  PHQ 2/9 Scores 02/20/2016  PHQ - 2 Score 0    Physical Exam  Constitutional: She is oriented to person, place, and time. She appears well-developed. No distress.  HENT:  Head: Normocephalic and atraumatic.  Cardiovascular: Normal rate, regular rhythm and normal heart sounds.  Pulmonary/Chest: Effort normal and breath sounds normal. No respiratory distress. She has no wheezes.  Abdominal: Soft. Bowel sounds are normal. There is no tenderness.  Musculoskeletal: Normal range of motion.  Neurological: She is alert and oriented to person, place, and time.  Skin: Skin is warm and dry. No rash noted.  Psychiatric: She has a normal mood and affect. Her behavior is normal. Thought content normal.  Nursing note and vitals reviewed.   BP 120/72   Pulse 68   Temp 98 F (36.7 C) (  Oral)   Ht 5\' 9"  (1.753 m)   Wt 216 lb (98 kg)   LMP 12/07/2016 (Within Days)   BMI 31.90 kg/m   Assessment and Plan: 1. OAB (overactive bladder) UA negative Begin medication - samples given See GYN next week as planned - mirabegron ER (MYRBETRIQ) 25 MG TB24 tablet; Take 1 tablet (25 mg total) by mouth daily.  Dispense: 30 tablet; Refill: 0 - POCT urinalysis dipstick   Meds ordered this encounter  Medications  . mirabegron ER (MYRBETRIQ) 25 MG  TB24 tablet    Sig: Take 1 tablet (25 mg total) by mouth daily.    Dispense:  30 tablet    Refill:  0    Partially dictated using Editor, commissioning. Any errors are unintentional.  Halina Maidens, MD [facility]  12/21/2016

## 2016-12-30 ENCOUNTER — Ambulatory Visit: Payer: BLUE CROSS/BLUE SHIELD | Admitting: Obstetrics and Gynecology

## 2017-01-04 ENCOUNTER — Encounter: Payer: Self-pay | Admitting: Obstetrics and Gynecology

## 2017-01-04 ENCOUNTER — Ambulatory Visit (INDEPENDENT_AMBULATORY_CARE_PROVIDER_SITE_OTHER): Payer: BLUE CROSS/BLUE SHIELD | Admitting: Obstetrics and Gynecology

## 2017-01-04 VITALS — BP 110/70 | HR 87 | Ht 69.0 in | Wt 221.0 lb

## 2017-01-04 DIAGNOSIS — N3010 Interstitial cystitis (chronic) without hematuria: Secondary | ICD-10-CM | POA: Diagnosis not present

## 2017-01-04 DIAGNOSIS — Z1211 Encounter for screening for malignant neoplasm of colon: Secondary | ICD-10-CM | POA: Diagnosis not present

## 2017-01-04 DIAGNOSIS — Z0000 Encounter for general adult medical examination without abnormal findings: Secondary | ICD-10-CM | POA: Diagnosis not present

## 2017-01-04 DIAGNOSIS — N852 Hypertrophy of uterus: Secondary | ICD-10-CM

## 2017-01-04 DIAGNOSIS — Z23 Encounter for immunization: Secondary | ICD-10-CM | POA: Diagnosis not present

## 2017-01-04 DIAGNOSIS — N3281 Overactive bladder: Secondary | ICD-10-CM

## 2017-01-04 MED ORDER — MIRABEGRON ER 25 MG PO TB24: 25.0000 mg | ORAL_TABLET | Freq: Every day | ORAL | 12 refills | Status: DC

## 2017-01-04 MED ORDER — PENTOSAN POLYSULFATE SODIUM 100 MG PO CAPS: 100.0000 mg | ORAL_CAPSULE | Freq: Three times a day (TID) | ORAL | Status: DC

## 2017-01-04 NOTE — Patient Instructions (Signed)
Interstitial Cystitis Interstitial cystitis is a condition that causes inflammation of the bladder. The bladder is a hollow organ in the lower part of your abdomen. It stores urine after the urine is made by your kidneys. With interstitial cystitis, you may have pain in the bladder area. You may also have a frequent and urgent need to urinate. The severity of interstitial cystitis can vary from person to person. You may have flare-ups of the condition, and then it may go away for a while. For many people who have this condition, it becomes a long-term problem. What are the causes? The cause of this condition is not known. What increases the risk? This condition is more likely to develop in women. What are the signs or symptoms? Symptoms of interstitial cystitis vary, and they can change over time. Symptoms may include:  Discomfort or pain in the bladder area. This can range from mild to severe. The pain may change in intensity as the bladder fills with urine or as it empties.  Pelvic pain.  An urgent need to urinate.  Frequent urination.  Pain during sexual intercourse.  Pinpoint bleeding on the bladder wall.  For women, the symptoms often get worse during menstruation. How is this diagnosed? This condition is diagnosed by evaluating your symptoms and ruling out other causes. A physical exam will be done. Various tests may be done to rule out other conditions. Common tests include:  Urine tests.  Cystoscopy. In this test, a tool that is like a very thin telescope is used to look into your bladder.  Biopsy. This involves taking a sample of tissue from the bladder wall to be examined under a microscope.  How is this treated? There is no cure for interstitial cystitis, but treatment methods are available to control your symptoms. Work closely with your health care provider to find the treatments that will be most effective for you. Treatment options may include:  Medicines to relieve  pain and to help reduce the number of times that you feel the need to urinate.  Bladder training. This involves learning ways to control when you urinate, such as: ? Urinating at scheduled times. ? Training yourself to delay urination. ? Doing exercises (Kegel exercises) to strengthen the muscles that control urine flow.  Lifestyle changes, such as changing your diet or taking steps to control stress.  Use of a device that provides electrical stimulation in order to reduce pain.  A procedure that stretches your bladder by filling it with air or fluid.  Surgery. This is rare. It is only done for extreme cases if other treatments do not help.  Follow these instructions at home:  Take medicines only as directed by your health care provider.  Use bladder training techniques as directed. ? Keep a bladder diary to find out which foods, liquids, or activities make your symptoms worse. ? Use your bladder diary to schedule bathroom trips. If you are away from home, plan to be near a bathroom at each of your scheduled times. ? Make sure you urinate just before you leave the house and just before you go to bed.  Do Kegel exercises as directed by your health care provider.  Do not drink alcohol.  Do not use any tobacco products, including cigarettes, chewing tobacco, or electronic cigarettes. If you need help quitting, ask your health care provider.  Make dietary changes as directed by your health care provider. You may need to avoid spicy foods and foods that contain a high amount  of potassium.  Limit your drinking of beverages that stimulate urination. These include soda, coffee, and tea.  Keep all follow-up visits as directed by your health care provider. This is important. Contact a health care provider if:  Your symptoms do not get better after treatment.  Your pain and discomfort are getting worse.  You have more frequent urges to urinate.  You have a fever. Get help right away  if:  You are not able to control your bladder at all. This information is not intended to replace advice given to you by your health care provider. Make sure you discuss any questions you have with your health care provider. Document Released: 09/06/2003 Document Revised: 06/13/2015 Document Reviewed: 09/12/2013 Elsevier Interactive Patient Education  2018 [employer].    Urinary Frequency, Adult Urinary frequency means urinating more often than usual. People with urinary frequency urinate at least 8 times in 24 hours, even if they drink a normal amount of fluid. Although they urinate more often than normal, the total amount of urine produced in a day may be normal. Urinary frequency is also called pollakiuria. What are the causes? This condition may be caused by:  A urinary tract infection.  Obesity.  Bladder problems, such as bladder stones.  Caffeine or alcohol.  Eating food or drinking fluids that irritate the bladder. These include coffee, tea, soda, artificial sweeteners, citrus, tomato-based foods, and chocolate.  Certain medicines, such as medicines that help the body get rid of extra fluid (diuretics).  Muscle or nerve weakness.  Overactive bladder.  Chronic diabetes.  Interstitial cystitis.  In men, problems with the prostate, such as an enlarged prostate.  In women, pregnancy.  In some cases, the cause may not be known. What increases the risk? This condition is more likely to develop in:  Women who have gone through menopause.  Men with prostate problems.  People with a disease or injury that affects the nerves or spinal cord.  People who have or have had a condition that affects the brain, such as a stroke.  What are the signs or symptoms? Symptoms of this condition include:  Feeling an urgent need to urinate often. The stress and anxiety of needing to find a bathroom quickly can make this urge worse.  Urinating 8 or more times in 24  hours.  Urinating as often as every 1 to 2 hours.  How is this diagnosed? This condition is diagnosed based on your symptoms, your medical history, and a physical exam. You may have tests, such as:  Blood tests.  Urine tests.  Imaging tests, such as X-rays or ultrasounds.  A bladder test.  A test of your neurological system. This is the body system that senses the need to urinate.  A test to check for problems in the urethra and bladder called cystoscopy.  You may also be asked to keep a bladder diary. A bladder diary is a record of what you eat and drink, how often you urinate, and how much you urinate. You may need to see a health care provider who specializes in conditions of the urinary tract (urologist) or kidneys (nephrologist). How is this treated? Treatment for this condition depends on the cause. Sometimes the condition goes away on its own and treatment is not necessary. If treatment is needed, it may include:  Taking medicine.  Learning exercises that strengthen the muscles that help control urination.  Following a bladder training program. This may include: ? Learning to delay going to the bathroom. ?  Double urinating (voiding). This helps if you are not completely emptying your bladder. ? Scheduled voiding.  Making diet changes, such as: ? Avoiding caffeine. ? Drinking fewer fluids, especially alcohol. ? Not drinking in the evening. ? Not having foods or drinks that may irritate the bladder. ? Eating foods that help prevent or ease constipation. Constipation can make this condition worse.  Having the nerves in your bladder stimulated. There are two options for stimulating the nerves to your bladder: ? Outpatient electrical nerve stimulation. This is done by your health care provider. ? Surgery to implant a bladder pacemaker. The pacemaker helps to control the urge to urinate.  Follow these instructions at home:  Keep a bladder diary if told to by your health  care provider.  Take over-the-counter and prescription medicines only as told by your health care provider.  Do any exercises as told by your health care provider.  Follow a bladder training program as told by your health care provider.  Make any recommended diet changes.  Keep all follow-up visits as told by your health care provider. This is important. Contact a health care provider if:  You start urinating more often.  You feel pain or irritation when you urinate.  You notice blood in your urine.  Your urine looks cloudy.  You develop a fever.  You begin vomiting. Get help right away if:  You are unable to urinate. This information is not intended to replace advice given to you by your health care provider. Make sure you discuss any questions you have with your health care provider. Document Released: 11/01/2008 Document Revised: 02/06/2015 Document Reviewed: 08/01/2014 Elsevier Interactive Patient Education  [employer].

## 2017-01-04 NOTE — Progress Notes (Signed)
Gynecology Annual Exam  PCP: [name], MD  Chief Complaint:  Chief Complaint  Patient presents with  . Gynecologic Exam    History of Present Illness: Patient is a 46 y.o. [id number] presents for annual exam. The patient has additional complaints today of urinary frequency and discomfort. She reports that she visits the bathroom about every hour. When she goes she often feel like she has not emptied her bladder and she has discomfort. She has thought many times that she has a urinary tract infection, but all of the tests are negative. She has modified her diet to drink less coffee because this cause pain and discomfort as well as avoiding tea. She describes the pain as intermittent and dull ache. She reports that she has had accidents before, but very rarely. She had stress incontinence lately when she was ill with a bad cold and coughed a lot. She denies pain with intercourse.  She was recently started on myrbetriq for frequency. She has had some improvement of her symptoms and no side effects such as dry mouth. She wants to continue this medication.    LMP: Patient's last menstrual period was 12/11/2016 (exact date). Average Interval: regular, 28 days Duration of flow: 4 days Heavy Menses: no Clots: no Intermenstrual Bleeding: no Postcoital Bleeding: no Dysmenorrhea: no   The patient is sexually active. She currently uses OCP (estrogen/progesterone) for contraception. She denies dyspareunia.  The patient does not perform self breast exams.  There is no notable family history of breast or ovarian cancer in her family.  The patient wears seatbelts: yes.   The patient has regular exercise: yes.   She plays tennis.  The patient denies current symptoms of depression.    Review of Systems: Review of Systems  Constitutional: Negative for chills, fever, malaise/fatigue and weight loss.  HENT: Negative for congestion, hearing loss and sinus pain.   Eyes: Negative for blurred  vision and double vision.  Respiratory: Negative for cough, sputum production, shortness of breath and wheezing.   Cardiovascular: Negative for chest pain, palpitations, orthopnea and leg swelling.  Gastrointestinal: Negative for abdominal pain, constipation, diarrhea, nausea and vomiting.  Genitourinary: Positive for dysuria, frequency and urgency. Negative for flank pain and hematuria.  Musculoskeletal: Negative for back pain, falls and joint pain.  Skin: Negative for itching and rash.  Neurological: Negative for dizziness and headaches.  Psychiatric/Behavioral: Negative for depression, substance abuse and suicidal ideas. The patient is not nervous/anxious.     Past Medical History:  Past Medical History:  Diagnosis Date  . Basal cell carcinoma 2011,2014   forehead  . Cancer ([place])    skin ca  . Migraine   . Schizoaffective disorder [facility])     Past Surgical History:  Past Surgical History:  Procedure Laterality Date  . BASAL CELL CARCINOMA EXCISION    . MOHS SURGERY  2012    Gynecologic History:  Patient's last menstrual period was 12/11/2016 (exact date). Contraception: OCP (estrogen/progesterone) Last Pap: Results were: 12/11/14 NIL HPV negative     Last mammogram: 2018 Results were: BI-RAD I Obstetric History: [id number]  Family History:  Family History  Problem Relation Age of Onset  . Cancer Father        brain  . Breast cancer Cousin 25  . Cancer Maternal Grandmother        ovarian  . Colon cancer Maternal Grandmother     Social History:  Social History   Socioeconomic History  . Marital status: Married  Spouse name: Not on file  . Number of children: Not on file  . Years of education: Not on file  . Highest education level: Not on file  Social Needs  . Financial resource strain: Not on file  . Food insecurity - worry: Not on file  . Food insecurity - inability: Not on file  . Transportation needs - medical: Not on file  . Transportation needs -  non-medical: Not on file  Occupational History  . Not on file  Tobacco Use  . Smoking status: Never Smoker  . Smokeless tobacco: Never Used  Substance and Sexual Activity  . Alcohol use: Yes    Alcohol/week: 0.0 oz    Comment: occasional  . Drug use: No  . Sexual activity: Yes    Birth control/protection: Pill  Other Topics Concern  . Not on file  Social History Narrative  . Not on file    Allergies:  No Known Allergies  Medications: Prior to Admission medications   Medication Sig Start Date End Date Taking? Authorizing Provider  divalproex (DEPAKOTE ER) 500 MG 24 hr tablet Take 3 tablets (1,500 mg total) by mouth daily. 09/16/16  Yes [name], MD  mirabegron ER (MYRBETRIQ) 25 MG TB24 tablet Take 1 tablet (25 mg total) by mouth daily. 12/21/16  Yes [name], MD  norgestrel-ethinyl estradiol (LOW-OGESTREL) 0.3-30 MG-MCG tablet Take 1 tablet by mouth daily. 09/01/16  Yes [name], MD  risperiDONE (RISPERDAL) 1 MG tablet Take 1 tablet (1 mg total) by mouth at bedtime. 09/01/16  Yes [name], MD  zolmitriptan (ZOMIG-ZMT) 5 MG disintegrating tablet TAKE 1 TABLET BY MOUTH DAILY AS NEEDED 05/02/15  Yes [name], MD    Physical Exam Vitals: Blood pressure 110/70, pulse 87, height 5\' 9"  (1.753 m), weight 221 lb (100.2 kg), last menstrual period 12/11/2016.  Physical Exam  Constitutional: She is oriented to person, place, and time. She appears well-developed.  Genitourinary: Vagina normal. There is no lesion on the right labia. There is no lesion on the left labia. Vagina exhibits no lesion. Right adnexum does not display mass. Left adnexum does not display mass. Cervix does not exhibit motion tenderness.   Uterus is enlarged and mobile. Uterus is not tender.  Genitourinary Comments: Patient has 10-12 cm uterus. Anteverted. No pelvic masses palpated.   HENT:  Head: Normocephalic and atraumatic.  Eyes: EOM are normal.  Neck: Neck supple. No  thyromegaly present.  Cardiovascular: Normal rate, regular rhythm and normal heart sounds.  Pulmonary/Chest: Effort normal and breath sounds normal. Right breast exhibits no inverted nipple, no mass, no nipple discharge and no skin change. Left breast exhibits no inverted nipple, no mass, no nipple discharge and no skin change.  Abdominal: Soft. Bowel sounds are normal. She exhibits no distension and no mass.  Neurological: She is alert and oriented to person, place, and time.  Skin: Skin is warm and dry.  Psychiatric: She has a normal mood and affect. Her behavior is normal. Judgment and thought content normal.  Vitals reviewed.    Female chaperone present for pelvic and breast  portions of the physical exam  Assessment: 46 y.o. G2P2002 routine annual exam  Plan: Problem List Items Addressed This Visit    None    Visit Diagnoses    Need for prophylactic vaccination and inoculation against influenza    -  Primary   Relevant Orders   Flu Vaccine QUAD 36+ mos IM (Fluarix, Quad PF) (Completed)  Interstitial cystitis       Relevant Medications   pentosan polysulfate (ELMIRON) capsule 100 mg   Bulky or enlarged uterus       Relevant Orders   US PELVIS TRANSVANGINAL NON-OB (TV ONLY)   Screening for colon cancer       Relevant Orders   Ambulatory referral to Gastroenterology   Healthcare maintenance          1) Mammogram - recommend yearly screening mammogram.  Mammogram Was ordered today  2) STI screening was offered and declined  3) ASCCP guidelines and rational discussed.  Patient opts for every 5 years screening interval  4) Colonoscopy -- Screening recommended starting at age 67 for average risk individuals, age 62 for individuals deemed at increased risk (including African Americans) and recommended to continue until age 42.  For patient age 95-85 individualized approach is recommended.  Gold standard screening is via colonoscopy, Cologuard screening is an acceptable  alternative for patient unwilling or unable to undergo colonoscopy.  "Colorectal cancer screening for average?risk adults: 2018 guideline update from the American Cancer Society"[place]: A Cancer Journal for Clinicians: Jun 17, 2016   5) Routine healthcare maintenance including cholesterol, diabetes screening discussed managed by PCP  6) Symptoms of bladder pain and frequency possibly related to Interstitial Cystitis. Discussed foods to avoid with patient. Discussed diagnostic options. Discussed initiating therapy to see if she benefits from this medication. Will continue prescription for myrbetriq. Will have patient return in 1 month. If patient is not adequately being treated asked to to complete a three day voiding log before she return for her visit. Given blank voiding diary and toilet hat.   7) Patient has bulky enlarged uterus on pelvic exam. Will order [place] to evaluate for fibroids.

## 2017-01-05 ENCOUNTER — Other Ambulatory Visit: Payer: Self-pay | Admitting: Obstetrics and Gynecology

## 2017-01-05 ENCOUNTER — Telehealth: Payer: Self-pay

## 2017-01-05 ENCOUNTER — Encounter: Payer: Self-pay | Admitting: Obstetrics and Gynecology

## 2017-01-05 DIAGNOSIS — R350 Frequency of micturition: Secondary | ICD-10-CM

## 2017-01-05 MED ORDER — OXYBUTYNIN CHLORIDE ER 5 MG PO TB24: 5.0000 mg | ORAL_TABLET | Freq: Every day | ORAL | 12 refills | Status: DC

## 2017-01-05 MED ORDER — PENTOSAN POLYSULFATE SODIUM 100 MG PO CAPS: 100.0000 mg | ORAL_CAPSULE | Freq: Three times a day (TID) | ORAL | 3 refills | Status: DC

## 2017-01-05 NOTE — Telephone Encounter (Signed)
Returned call and spoke with patient. Thank you.

## 2017-01-05 NOTE — Telephone Encounter (Signed)
Pt states she saw Schuman yesterday and her Rx is not at the pharmacy. [facility] [place].

## 2017-01-05 NOTE — Telephone Encounter (Signed)
Patient called stating she was giving Samples if Merbetriq. It is very expensive if she got the prescription sent in. But she spoke with insurance company and they gave her a similar medication to get called in that's less expensive. TROSPIUM 20 mg.   Please Advise.

## 2017-01-05 NOTE — Progress Notes (Signed)
Patient would like to try Ditropan instead of Myrbetriq because of the cost.

## 2017-01-05 NOTE — Telephone Encounter (Signed)
Beth Lewis is not really very similar but if she wants to try it, I will send in an Rx.

## 2017-01-06 ENCOUNTER — Telehealth: Payer: Self-pay

## 2017-01-06 NOTE — Telephone Encounter (Signed)
Please advise. Thank you

## 2017-01-06 NOTE — Telephone Encounter (Signed)
Elmiron is too expensive, pt would have to pay $700 copay.  Is there something similar that is less expensive?  [phone]

## 2017-01-06 NOTE — Telephone Encounter (Signed)
Called and discussed with patient

## 2017-01-06 NOTE — Telephone Encounter (Signed)
Patient informed of can try meds if she wanted even tho not very similar. Patient seen GYNECOLOGIST for this issue and she prescribed her something else to try that is cheaper. So she will continue with gynecologist for this issue and medication.

## 2017-01-07 ENCOUNTER — Ambulatory Visit: Payer: BLUE CROSS/BLUE SHIELD | Admitting: Obstetrics and Gynecology

## 2017-02-04 ENCOUNTER — Other Ambulatory Visit: Payer: BLUE CROSS/BLUE SHIELD

## 2017-02-04 ENCOUNTER — Ambulatory Visit: Payer: BLUE CROSS/BLUE SHIELD | Admitting: Obstetrics and Gynecology

## 2017-02-22 ENCOUNTER — Encounter: Payer: Self-pay | Admitting: Internal Medicine

## 2017-02-23 ENCOUNTER — Encounter: Payer: Self-pay | Admitting: Internal Medicine

## 2017-02-23 ENCOUNTER — Ambulatory Visit (INDEPENDENT_AMBULATORY_CARE_PROVIDER_SITE_OTHER): Payer: BLUE CROSS/BLUE SHIELD | Admitting: Internal Medicine

## 2017-02-23 VITALS — BP 98/68 | HR 75 | Ht 69.0 in | Wt 213.0 lb

## 2017-02-23 DIAGNOSIS — E785 Hyperlipidemia, unspecified: Secondary | ICD-10-CM

## 2017-02-23 DIAGNOSIS — F258 Other schizoaffective disorders: Secondary | ICD-10-CM | POA: Diagnosis not present

## 2017-02-23 DIAGNOSIS — G43009 Migraine without aura, not intractable, without status migrainosus: Secondary | ICD-10-CM

## 2017-02-23 DIAGNOSIS — Z0000 Encounter for general adult medical examination without abnormal findings: Secondary | ICD-10-CM

## 2017-02-23 DIAGNOSIS — Z79899 Other long term (current) drug therapy: Secondary | ICD-10-CM

## 2017-02-23 DIAGNOSIS — Z0001 Encounter for general adult medical examination with abnormal findings: Secondary | ICD-10-CM | POA: Diagnosis not present

## 2017-02-23 DIAGNOSIS — F259 Schizoaffective disorder, unspecified: Secondary | ICD-10-CM

## 2017-02-23 DIAGNOSIS — Z1239 Encounter for other screening for malignant neoplasm of breast: Secondary | ICD-10-CM

## 2017-02-23 LAB — POCT URINALYSIS DIPSTICK
Bilirubin, UA: NEGATIVE
GLUCOSE UA: NEGATIVE
KETONES UA: NEGATIVE
Nitrite, UA: NEGATIVE
Protein, UA: NEGATIVE
SPEC GRAV UA: 1.02 (ref 1.010–1.025)
Urobilinogen, UA: 0.2 E.U./dL
pH, UA: 6 (ref 5.0–8.0)

## 2017-02-23 NOTE — Patient Instructions (Signed)

## 2017-02-23 NOTE — Progress Notes (Signed)
Date:  02/23/2017   Name:  Beth Lewis   DOB:  28-Sep-1970   MRN:  [id number]   Chief Complaint: Annual Exam (Has obgyn. ) Beth Lewis is a 47 y.o. female who presents today for her Complete Annual Exam. She feels well. She reports exercising walking several times per week. She reports she is sleeping well. She denies breast problems. She had pelvic and breast exam by GYN.  Pap due next year.  Migraine   This is a recurrent problem. The problem occurs intermittently. The problem has been unchanged. Pertinent negatives include no abdominal pain, coughing, dizziness, fever, hearing loss, tinnitus or vomiting. She has tried triptans for the symptoms. The treatment provided significant relief.   Schizoaffective disorder - doing very well on current therapy.  No mood changes, sleep issues, trouble concentrating or change in appetite.  Weight is stable.   Review of Systems  Constitutional: Negative for chills, fatigue and fever.  HENT: Negative for congestion, hearing loss, tinnitus, trouble swallowing and voice change.   Eyes: Negative for visual disturbance.  Respiratory: Negative for cough, chest tightness, shortness of breath and wheezing.   Cardiovascular: Negative for chest pain, palpitations and leg swelling.  Gastrointestinal: Negative for abdominal pain, constipation, diarrhea and vomiting.  Endocrine: Negative for polydipsia and polyuria.  Genitourinary: Negative for dysuria, frequency, genital sores, vaginal bleeding and vaginal discharge.  Musculoskeletal: Negative for arthralgias, gait problem and joint swelling.  Skin: Negative for color change and rash.  Neurological: Negative for dizziness, tremors, light-headedness and headaches.  Hematological: Negative for adenopathy. Does not bruise/bleed easily.  Psychiatric/Behavioral: Negative for dysphoric mood and sleep disturbance. The patient is not nervous/anxious.     Patient Active Problem List   Diagnosis Date  Noted  . Knee strain, right, initial encounter 02/20/2016  . Dyslipidemia 07/16/2014  . Migraine without aura and responsive to treatment 07/16/2014  . Chronic schizoaffective disorder ([place]) 07/16/2014    Prior to Admission medications   Medication Sig Start Date End Date Taking? Authorizing Provider  divalproex (DEPAKOTE ER) 500 MG 24 hr tablet Take 3 tablets (1,500 mg total) by mouth daily. 09/16/16  Yes [name], MD      Yes Homero Fellers, MD  norgestrel-ethinyl estradiol (LOW-OGESTREL) 0.3-30 MG-MCG tablet Take 1 tablet by mouth daily. 09/01/16  Yes [name], MD      Yes Schuman, Stefanie Libel, MD      Yes Homero Fellers, MD  risperiDONE (RISPERDAL) 1 MG tablet Take 1 tablet (1 mg total) by mouth at bedtime. 09/01/16  Yes [name], MD  zolmitriptan (ZOMIG-ZMT) 5 MG disintegrating tablet TAKE 1 TABLET BY MOUTH DAILY AS NEEDED 05/02/15  Yes [name], MD    No Known Allergies  Past Surgical History:  Procedure Laterality Date  . BASAL CELL CARCINOMA EXCISION    . MOHS SURGERY  2012    Social History   Tobacco Use  . Smoking status: Never Smoker  . Smokeless tobacco: Never Used  Substance Use Topics  . Alcohol use: Yes    Alcohol/week: 0.0 oz    Comment: occasional  . Drug use: No     Medication list has been reviewed and updated.  PHQ 2/9 Scores 02/23/2017 02/20/2016  PHQ - 2 Score 0 0    Physical Exam  Constitutional: She is oriented to person, place, and time. She appears well-developed and well-nourished. No distress.  HENT:  Head: Normocephalic and atraumatic.  Right Ear: Tympanic  membrane and ear canal normal.  Left Ear: Tympanic membrane and ear canal normal.  Nose: Right sinus exhibits no maxillary sinus tenderness. Left sinus exhibits no maxillary sinus tenderness.  Mouth/Throat: Uvula is midline and oropharynx is clear and moist.  Eyes: Conjunctivae and EOM are normal. Right eye exhibits no discharge. Left eye  exhibits no discharge. No scleral icterus.  Neck: Normal range of motion. Carotid bruit is not present. No erythema present. No thyromegaly present.  Cardiovascular: Normal rate, regular rhythm, normal heart sounds and normal pulses.  Pulmonary/Chest: Effort normal. No respiratory distress. She has no wheezes.  Abdominal: Soft. Bowel sounds are normal. There is no hepatosplenomegaly. There is no tenderness. There is no CVA tenderness.  Musculoskeletal: Normal range of motion.  Lymphadenopathy:    She has no cervical adenopathy.    She has no axillary adenopathy.  Neurological: She is alert and oriented to person, place, and time. She has normal reflexes. No cranial nerve deficit or sensory deficit.  Skin: Skin is warm, dry and intact. No rash noted.  Psychiatric: She has a normal mood and affect. Her speech is normal and behavior is normal. Thought content normal.  Nursing note and vitals reviewed.   BP 98/68   Pulse 75   Ht 5\' 9"  (1.753 m)   Wt 213 lb (96.6 kg)   LMP 02/09/2017 (Exact Date)   SpO2 98%   BMI 31.45 kg/m   Assessment and Plan: 1. Annual physical exam Continue regular exercise and healthy diet - POCT urinalysis dipstick  2. Breast cancer screening - MM DIGITAL SCREENING BILATERAL; Future  3. Migraine without aura and responsive to treatment Intermittent, responsive to triptans  4. Dyslipidemia Check and advise - Comprehensive metabolic panel - Lipid panel  5. Chronic schizoaffective disorder ([place]) Doing well - continue current therapy  6. Long-term use of high-risk medication - CBC with Differential/Platelet - Comprehensive metabolic panel - TSH - Valproic Acid level - Hemoglobin A1c   No orders of the defined types were placed in this encounter.   Partially dictated using Editor, commissioning. Any errors are unintentional.  Halina Maidens, MD [facility]  02/23/2017

## 2017-02-24 LAB — COMPREHENSIVE METABOLIC PANEL
A/G RATIO: 1.4 (ref 1.2–2.2)
ALT: 15 IU/L (ref 0–32)
AST: 14 IU/L (ref 0–40)
Albumin: 4.2 g/dL (ref 3.5–5.5)
Alkaline Phosphatase: 72 IU/L (ref 39–117)
BILIRUBIN TOTAL: 0.3 mg/dL (ref 0.0–1.2)
BUN/Creatinine Ratio: 14 (ref 9–23)
BUN: 13 mg/dL (ref 6–24)
CALCIUM: 9.8 mg/dL (ref 8.7–10.2)
CHLORIDE: 101 mmol/L (ref 96–106)
CO2: 21 mmol/L (ref 20–29)
Creatinine, Ser: 0.93 mg/dL (ref 0.57–1.00)
GFR, EST AFRICAN AMERICAN: 86 mL/min/{1.73_m2} (ref >59)
GFR, EST NON AFRICAN AMERICAN: 74 mL/min/{1.73_m2} (ref >59)
GLOBULIN, TOTAL: 3.1 g/dL (ref 1.5–4.5)
Glucose: 83 mg/dL (ref 65–99)
POTASSIUM: 5.1 mmol/L (ref 3.5–5.2)
SODIUM: 139 mmol/L (ref 134–144)
Total Protein: 7.3 g/dL (ref 6.0–8.5)

## 2017-02-24 LAB — LIPID PANEL
CHOL/HDL RATIO: 4.6 ratio — AB (ref 0.0–4.4)
Cholesterol, Total: 197 mg/dL (ref 100–199)
HDL: 43 mg/dL (ref >39)
LDL Calculated: 118 mg/dL — ABNORMAL HIGH (ref 0–99)
Triglycerides: 178 mg/dL — ABNORMAL HIGH (ref 0–149)
VLDL Cholesterol Cal: 36 mg/dL (ref 5–40)

## 2017-02-24 LAB — HEMOGLOBIN A1C
ESTIMATED AVERAGE GLUCOSE: 103 mg/dL
HEMOGLOBIN A1C: 5.2 % (ref 4.8–5.6)

## 2017-02-24 LAB — CBC WITH DIFFERENTIAL/PLATELET
BASOS: 0 %
Basophils Absolute: 0 10*3/uL (ref 0.0–0.2)
EOS (ABSOLUTE): 0.1 10*3/uL (ref 0.0–0.4)
EOS: 1 %
HEMATOCRIT: 43.7 % (ref 34.0–46.6)
Hemoglobin: 14.8 g/dL (ref 11.1–15.9)
IMMATURE GRANULOCYTES: 0 %
Immature Grans (Abs): 0 10*3/uL (ref 0.0–0.1)
Lymphocytes Absolute: 3 10*3/uL (ref 0.7–3.1)
Lymphs: 33 %
MCH: 32.4 pg (ref 26.6–33.0)
MCHC: 33.9 g/dL (ref 31.5–35.7)
MCV: 96 fL (ref 79–97)
Monocytes Absolute: 0.5 10*3/uL (ref 0.1–0.9)
Monocytes: 6 %
NEUTROS ABS: 5.5 10*3/uL (ref 1.4–7.0)
NEUTROS PCT: 60 %
PLATELETS: 358 10*3/uL (ref 150–379)
RBC: 4.57 x10E6/uL (ref 3.77–5.28)
RDW: 13.9 % (ref 12.3–15.4)
WBC: 9.1 10*3/uL (ref 3.4–10.8)

## 2017-02-24 LAB — VALPROIC ACID LEVEL: Valproic Acid Lvl: 57 ug/mL (ref 50–100)

## 2017-02-24 LAB — TSH: TSH: 3.41 u[IU]/mL (ref 0.450–4.500)

## 2017-04-27 ENCOUNTER — Ambulatory Visit
Admission: RE | Admit: 2017-04-27 | Discharge: 2017-04-27 | Disposition: A | Discharge status: Home / Self Care | Payer: BLUE CROSS/BLUE SHIELD | Source: Ambulatory Visit | Attending: Internal Medicine | Admitting: Internal Medicine

## 2017-04-27 DIAGNOSIS — Z1231 Encounter for screening mammogram for malignant neoplasm of breast: Secondary | ICD-10-CM | POA: Diagnosis not present

## 2017-04-27 DIAGNOSIS — Z1239 Encounter for other screening for malignant neoplasm of breast: Secondary | ICD-10-CM

## 2017-05-14 DIAGNOSIS — S6992XA Unspecified injury of left wrist, hand and finger(s), initial encounter: Secondary | ICD-10-CM | POA: Diagnosis not present

## 2017-05-14 DIAGNOSIS — S63637A Sprain of interphalangeal joint of left little finger, initial encounter: Secondary | ICD-10-CM | POA: Diagnosis not present

## 2017-07-29 ENCOUNTER — Other Ambulatory Visit: Payer: Self-pay | Admitting: Internal Medicine

## 2017-08-20 ENCOUNTER — Other Ambulatory Visit: Payer: Self-pay | Admitting: Internal Medicine

## 2017-09-07 ENCOUNTER — Other Ambulatory Visit: Payer: Self-pay | Admitting: Internal Medicine

## 2017-09-07 DIAGNOSIS — F259 Schizoaffective disorder, unspecified: Secondary | ICD-10-CM

## 2017-09-07 DIAGNOSIS — F258 Other schizoaffective disorders: Principal | ICD-10-CM

## 2017-09-21 DIAGNOSIS — R3911 Hesitancy of micturition: Secondary | ICD-10-CM | POA: Diagnosis not present

## 2017-09-21 DIAGNOSIS — R3915 Urgency of urination: Secondary | ICD-10-CM | POA: Diagnosis not present

## 2017-09-21 DIAGNOSIS — D219 Benign neoplasm of connective and other soft tissue, unspecified: Secondary | ICD-10-CM | POA: Diagnosis not present

## 2017-10-11 ENCOUNTER — Ambulatory Visit
Admission: EM | Admit: 2017-10-11 | Discharge: 2017-10-11 | Disposition: A | Discharge status: Home / Self Care | Payer: BLUE CROSS/BLUE SHIELD | Attending: Emergency Medicine | Admitting: Emergency Medicine

## 2017-10-11 ENCOUNTER — Ambulatory Visit: Payer: Self-pay | Admitting: Internal Medicine

## 2017-10-11 ENCOUNTER — Encounter: Payer: Self-pay | Admitting: Emergency Medicine

## 2017-10-11 ENCOUNTER — Other Ambulatory Visit: Payer: Self-pay

## 2017-10-11 DIAGNOSIS — N898 Other specified noninflammatory disorders of vagina: Secondary | ICD-10-CM

## 2017-10-11 LAB — WET PREP, GENITAL
Clue Cells Wet Prep HPF POC: NONE SEEN
Sperm: NONE SEEN
Trich, Wet Prep: NONE SEEN
Yeast Wet Prep HPF POC: NONE SEEN

## 2017-10-11 MED ORDER — TRIAMCINOLONE ACETONIDE 0.025 % EX CREA: 1.0000 "application " | TOPICAL_CREAM | Freq: Two times a day (BID) | CUTANEOUS | 0 refills | Status: DC

## 2017-10-11 NOTE — Discharge Instructions (Addendum)
Wash once a day.  Avoid irritating the area further by not constantly wiping.  If not improving in 1 week follow-up with the gynecologist

## 2017-10-11 NOTE — ED Triage Notes (Signed)
Patient c/o yellow/green vaginal discharge, itching and burning that started 4 days ago.

## 2017-10-15 ENCOUNTER — Other Ambulatory Visit: Payer: Self-pay | Admitting: Internal Medicine

## 2017-10-16 ENCOUNTER — Other Ambulatory Visit: Payer: Self-pay | Admitting: Internal Medicine

## 2017-11-11 DIAGNOSIS — D2262 Melanocytic nevi of left upper limb, including shoulder: Secondary | ICD-10-CM | POA: Diagnosis not present

## 2017-11-11 DIAGNOSIS — D2261 Melanocytic nevi of right upper limb, including shoulder: Secondary | ICD-10-CM | POA: Diagnosis not present

## 2017-11-11 DIAGNOSIS — D2272 Melanocytic nevi of left lower limb, including hip: Secondary | ICD-10-CM | POA: Diagnosis not present

## 2017-11-11 DIAGNOSIS — Z85828 Personal history of other malignant neoplasm of skin: Secondary | ICD-10-CM | POA: Diagnosis not present

## 2018-01-05 ENCOUNTER — Ambulatory Visit (INDEPENDENT_AMBULATORY_CARE_PROVIDER_SITE_OTHER): Payer: BLUE CROSS/BLUE SHIELD | Admitting: Obstetrics and Gynecology

## 2018-01-05 ENCOUNTER — Encounter: Payer: Self-pay | Admitting: Obstetrics and Gynecology

## 2018-01-05 ENCOUNTER — Other Ambulatory Visit (HOSPITAL_COMMUNITY)
Admission: RE | Admit: 2018-01-05 | Discharge: 2018-01-05 | Disposition: A | Discharge status: Home / Self Care | Payer: BLUE CROSS/BLUE SHIELD | Source: Ambulatory Visit | Attending: Obstetrics and Gynecology | Admitting: Obstetrics and Gynecology

## 2018-01-05 VITALS — BP 133/74 | HR 90 | Wt 222.0 lb

## 2018-01-05 DIAGNOSIS — Z124 Encounter for screening for malignant neoplasm of cervix: Secondary | ICD-10-CM

## 2018-01-05 DIAGNOSIS — R3915 Urgency of urination: Secondary | ICD-10-CM

## 2018-01-05 DIAGNOSIS — Z1339 Encounter for screening examination for other mental health and behavioral disorders: Secondary | ICD-10-CM

## 2018-01-05 DIAGNOSIS — Z1331 Encounter for screening for depression: Secondary | ICD-10-CM

## 2018-01-05 DIAGNOSIS — N3281 Overactive bladder: Secondary | ICD-10-CM | POA: Insufficient documentation

## 2018-01-05 DIAGNOSIS — Z01419 Encounter for gynecological examination (general) (routine) without abnormal findings: Secondary | ICD-10-CM | POA: Diagnosis not present

## 2018-01-05 DIAGNOSIS — N3941 Urge incontinence: Secondary | ICD-10-CM | POA: Insufficient documentation

## 2018-01-05 NOTE — Progress Notes (Signed)
Gynecology Annual Exam  PCP: [name], MD  Chief Complaint  Patient presents with  . Gynecologic Exam    leaking urine/overactive bladder   History of Present Illness:  Ms. Beth Lewis is a 47 y.o. [id number] who LMP was Patient's last menstrual period was 12/10/2017 (exact date)., presents today for her annual examination.  Her menses are regular every 28-30 days, lasting 3 day(s).  Dysmenorrhea none. She occasional have intermenstrual bleeding.  She does not have vasomotor symptoms.   She is sexually active. She uses low-ogestrel for contraception. She does have vaginal dryness.  Last Pap: 3 years  Results were: no abnormalities /neg HPV DNA negative.  Hx of STDs: none  Last mammogram: 04/2017  Results were: normal--routine follow-up in 12 months There is no FH of breast cancer. There is no FH of ovarian cancer. The patient does not do self-breast exams.  Colonoscopy: never had DEXA: has not been screened for osteoporosis  Tobacco use: The patient denies current or previous tobacco use. Alcohol use: social drinker Exercise: moderately active  The patient wears seatbelts: yes.     Frequency/urgency.  For the past few years she has had urinary frequency.  When she voids she does not get much out.  She has urinary urgency with urge incontinence.  She has tried oxybutynin and mirabegron (Myrbetriq), neither have really helped.  She has only seen a regular gynecologist for her issues. She has seldom had stress incontinence.    Past Medical History:  Diagnosis Date  . Basal cell carcinoma 2011,2014   forehead  . Cancer ([place])    skin ca  . Migraine    usually with her menses  . Schizoaffective disorder [facility])    Past Surgical History:  Procedure Laterality Date  . BASAL CELL CARCINOMA EXCISION    . MOHS SURGERY  2012    Prior to Admission medications   Medication Sig Start Date End Date Taking? Authorizing Provider  divalproex (DEPAKOTE ER) 500 MG 24 hr tablet  TAKE 3 TABLETS DAILY 10/15/17  Yes [name], MD  LOW-OGESTREL 0.3-30 MG-MCG tablet TAKE 1 TABLET DAILY 07/29/17  Yes [name], MD  risperiDONE (RISPERDAL) 1 MG tablet TAKE 1 TABLET AT BEDTIME 09/07/17  Yes [name], MD  zolmitriptan (ZOMIG-ZMT) 5 MG disintegrating tablet TAKE 1 TABLET BY MOUTH DAILY AS NEEDED 10/16/17  Yes [name], MD   Allergies: No Known Allergies  Obstetric History: G2P2002, s/p SVD x 2  Family History  Problem Relation Age of Onset  . Cancer Father        brain  . Breast cancer Cousin 25  . Colon cancer Maternal Grandmother 25  . Ovarian cancer Maternal Grandmother 97       mets to ovaries, not primary site    Social History   Socioeconomic History  . Marital status: Married    Spouse name: Not on file  . Number of children: Not on file  . Years of education: Not on file  . Highest education level: Not on file  Occupational History  . Not on file  Social Needs  . Financial resource strain: Not on file  . Food insecurity:    Worry: Not on file    Inability: Not on file  . Transportation needs:    Medical: Not on file    Non-medical: Not on file  Tobacco Use  . Smoking status: Never Smoker  . Smokeless tobacco: Never Used  Substance and Sexual Activity  .  Alcohol use: Yes    Alcohol/week: 0.0 standard drinks    Comment: occasional  . Drug use: No  . Sexual activity: Yes    Birth control/protection: Pill  Lifestyle  . Physical activity:    Days per week: 3 days    Minutes per session: 60 min  . Stress: Not at all  Relationships  . Social connections:    Talks on phone: More than three times a week    Gets together: Twice a week    Attends religious service: More than 4 times per year    Active member of club or organization: Yes    Attends meetings of clubs or organizations: More than 4 times per year    Relationship status: Married  . Intimate partner violence:    Fear of current or ex partner: No     Emotionally abused: No    Physically abused: No    Forced sexual activity: No  Other Topics Concern  . Not on file  Social History Narrative  . Not on file   Review of Systems  Constitutional: Negative.   HENT: Negative.   Eyes: Negative.   Respiratory: Negative.   Cardiovascular: Negative.   Gastrointestinal: Negative.   Genitourinary: Positive for frequency and urgency. Negative for dysuria, flank pain and hematuria.  Musculoskeletal: Negative.   Skin: Negative.   Neurological: Negative.   Psychiatric/Behavioral: Negative.      Physical Exam BP 133/74   Pulse 90   Wt 222 lb (100.7 kg)   LMP 12/10/2017 (Exact Date)   BMI 32.78 kg/m   Physical Exam Constitutional:      General: She is not in acute distress.    Appearance: She is well-developed.  Genitourinary:     Pelvic exam was performed with patient supine.     Vulva, inguinal canal, urethra, bladder, vagina, cervix and uterus normal.     No posterior fourchette tenderness, injury, rash or lesion present.     No inguinal adenopathy present in the right or left side.    No signs of injury in the vagina.     No vaginal discharge, erythema, tenderness or bleeding.     No cervical polyp.     Uterus is mobile.     Uterus is not enlarged or tender.     No uterine mass detected.    Uterus is anteverted.     No right or left adnexal mass present.     Right adnexa not tender or full.     Left adnexa not tender or full.  HENT:     Head: Normocephalic and atraumatic.  Eyes:     General: No scleral icterus.    Conjunctiva/sclera: Conjunctivae normal.  Neck:     Musculoskeletal: Normal range of motion and neck supple.     Thyroid: No thyromegaly.  Cardiovascular:     Rate and Rhythm: Normal rate and regular rhythm.     Heart sounds: No murmur. No friction rub. No gallop.   Pulmonary:     Effort: Pulmonary effort is normal. No respiratory distress.     Breath sounds: Normal breath sounds. No wheezing or rales.   Chest:     Breasts:        Right: No inverted nipple, mass, nipple discharge, skin change or tenderness.        Left: No inverted nipple, mass, nipple discharge, skin change or tenderness.  Abdominal:     General: Bowel sounds are normal. There is no distension.  Palpations: Abdomen is soft. There is no mass.     Tenderness: There is no abdominal tenderness. There is no guarding or rebound.  Musculoskeletal: Normal range of motion.        General: No tenderness.  Lymphadenopathy:     Cervical: No cervical adenopathy.     Lower Body: No right inguinal adenopathy. No left inguinal adenopathy.  Neurological:     General: No focal deficit present.     Mental Status: She is alert and oriented to person, place, and time.     Cranial Nerves: No cranial nerve deficit.  Skin:    General: Skin is warm and dry.     Findings: No erythema or rash.  Psychiatric:        Mood and Affect: Mood normal.        Behavior: Behavior normal.        Judgment: Judgment normal.     Female chaperone present for pelvic and breast  portions of the physical exam  Results: AUDIT Questionnaire (screen for alcoholism): 1 PHQ-9: 0  Assessment: 47 y.o. A2N0539 female here for routine gynecologic examination.  Plan: Problem List Items Addressed This Visit      Other   Urinary urgency   Relevant Orders   Ambulatory referral to Urogynecology   Urge incontinence of urine   Relevant Orders   Ambulatory referral to Urogynecology    Other Visit Diagnoses    Women's annual routine gynecological examination    -  Primary   Relevant Orders   Cytology - PAP   Ambulatory referral to Urogynecology   Screening for depression       Screening for alcoholism       Pap smear for cervical cancer screening       Relevant Orders   Cytology - PAP      Screening: -- Blood pressure screen normal -- Colonoscopy - not due -- Mammogram - due. Patient to call [place] to arrange. She understands that it is her  responsibility to arrange this. -- Weight screening: normal -- Depression screening negative (PHQ-9) -- Nutrition: normal -- cholesterol screening: per PCP -- osteoporosis screening: not due -- tobacco screening: not using -- alcohol screening: AUDIT questionnaire indicates low-risk usage. -- family history of breast cancer screening: done. not at high risk. -- no evidence of domestic violence or intimate partner violence. -- STD screening: gonorrhea/chlamydia NAAT not collected per patient request. -- pap smear collected per ASCCP guidelines -- Flu vaccine: 11/02/2017, completed  -- HPV vaccination series: not eligilbe  Urinary urgency with urge urinary incontinence: She is failed Myrbetriq as well as Ditropan.  Will send to [facility]cology for further evaluation and treatment.  Prentice Docker, MD 01/05/2018 6:33 PM

## 2018-01-06 NOTE — ED Provider Notes (Signed)
[facility]    CSN: [id number] Arrival date & time: 10/11/17  4098     History   Chief Complaint No chief complaint on file.   HPI Beth Lewis is a 47 y.o. female.   HPI Patient presents C/O vaginal discharge that is green/yellow. She had itching and burning as well. Sx started about 4 days ago. No fever or chills denies N/V.She is wiping herself frequently to stay "clean" Feels irritated. No UTI sx of frequency,urgency hematuria.      Past Medical History:  Diagnosis Date  . Basal cell carcinoma 2011,2014   forehead  . Cancer ([place])    skin ca  . Migraine    usually with her menses  . Schizoaffective disorder [facility])     Patient Active Problem List   Diagnosis Date Noted  . Urinary urgency 01/05/2018  . Urge incontinence of urine 01/05/2018  . Knee strain, right, initial encounter 02/20/2016  . Dyslipidemia 07/16/2014  . Migraine without aura and responsive to treatment 07/16/2014  . Chronic schizoaffective disorder ([place]) 07/16/2014    Past Surgical History:  Procedure Laterality Date  . BASAL CELL CARCINOMA EXCISION    . MOHS SURGERY  2012    OB History    Gravida  2   Para  2   Term  2   Preterm      AB      Living  2     SAB      TAB      Ectopic      Multiple      Live Births  2            Home Medications    Prior to Admission medications   Medication Sig Start Date End Date Taking? Authorizing Provider  LOW-OGESTREL 0.3-30 MG-MCG tablet TAKE 1 TABLET DAILY 07/29/17  Yes [name], MD  risperiDONE (RISPERDAL) 1 MG tablet TAKE 1 TABLET AT BEDTIME 09/07/17  Yes [name], MD  divalproex (DEPAKOTE ER) 500 MG 24 hr tablet TAKE 3 TABLETS DAILY 10/15/17   [name], MD  triamcinolone (KENALOG) 0.025 % cream Apply 1 application topically 2 (two) times daily. Do not use more than 7 days Patient not taking: Reported on 01/05/2018 10/11/17   Lorin Picket, PA-C  zolmitriptan (ZOMIG-ZMT) 5 MG  disintegrating tablet TAKE 1 TABLET BY MOUTH DAILY AS NEEDED 10/16/17   [name], MD    Family History Family History  Problem Relation Age of Onset  . Cancer Father        brain  . Breast cancer Cousin 25  . Colon cancer Maternal Grandmother 15  . Ovarian cancer Maternal Grandmother 97       mets to ovaries, not primary site    Social History Social History   Tobacco Use  . Smoking status: Never Smoker  . Smokeless tobacco: Never Used  Substance Use Topics  . Alcohol use: Yes    Alcohol/week: 0.0 standard drinks    Comment: occasional  . Drug use: No     Allergies   Patient has no known allergies.   Review of Systems Review of Systems  Constitutional: Negative for activity change, appetite change, chills, fatigue and fever.  Genitourinary: Positive for vaginal discharge and vaginal pain. Negative for dysuria, frequency, hematuria and urgency.  All other systems reviewed and are negative.    Physical Exam Triage Vital Signs ED Triage Vitals  Enc Vitals Group  BP 10/11/17 0943 112/79     Pulse Rate 10/11/17 0943 86     Resp 10/11/17 0943 18     Temp 10/11/17 0943 98.3 F (36.8 C)     Temp Source 10/11/17 0943 Oral     SpO2 10/11/17 0943 99 %     Weight 10/11/17 0945 217 lb (98.4 kg)     Height 10/11/17 0945 5\' 9"  (1.753 m)     Head Circumference --      Peak Flow --      Pain Score 10/11/17 0945 0     Pain Loc --      Pain Edu? --      Excl. in [place]? --    No data found.  Updated Vital Signs BP 112/79 (BP Location: Left Arm)   Pulse 86   Temp 98.3 F (36.8 C) (Oral)   Resp 18   Ht 5\' 9"  (1.753 m)   Wt 217 lb (98.4 kg)   LMP 09/24/2017   SpO2 99%   BMI 32.05 kg/m   Visual Acuity Right Eye Distance:   Left Eye Distance:   Bilateral Distance:    Right Eye Near:   Left Eye Near:    Bilateral Near:     Physical Exam Vitals signs and nursing note reviewed.  Constitutional:      General: She is not in acute distress.     Appearance: Normal appearance. She is not ill-appearing, toxic-appearing or diaphoretic.  HENT:     Head: Normocephalic.     Nose: Nose normal.     Mouth/Throat:     Mouth: Mucous membranes are moist.  Eyes:     Pupils: Pupils are equal, round, and reactive to light.  Genitourinary:    Comments: Patient performed self swab. Musculoskeletal: Normal range of motion.  Skin:    General: Skin is warm and dry.  Neurological:     General: No focal deficit present.     Mental Status: She is alert and oriented to person, place, and time.  Psychiatric:        Mood and Affect: Mood normal.        Behavior: Behavior normal.        Thought Content: Thought content normal.        Judgment: Judgment normal.      UC Treatments / Results  Labs (all labs ordered are listed, but only abnormal results are displayed) Labs Reviewed  WET PREP, GENITAL - Abnormal; Notable for the following components:      Result Value   WBC, Wet Prep HPF POC MANY (*)    All other components within normal limits    EKG None  Radiology No results found.  Procedures Procedures (including critical care time)  Medications Ordered in UC Medications - No data to display  Initial Impression / Assessment and Plan / UC Course  I have reviewed the triage vital signs and the nursing notes.  Pertinent labs & imaging results that were available during my care of the patient were reviewed by me and considered in my medical decision making (see chart for details).      Discharge Instructions     Wash once a day.  Avoid irritating the area further by not constantly wiping.  If not improving in 1 week follow-up with the gynecologist  Discussed wet prep findings. No evidence of infection. Doubt UTI. Need to decrease cleaning/wiping to decrease irritation.     Final Clinical Impressions(s) / UC Diagnoses  Final diagnoses:  Vaginal irritation     Discharge Instructions     Wash once a day.  Avoid  irritating the area further by not constantly wiping.  If not improving in 1 week follow-up with the gynecologist   ED Prescriptions    Medication Sig Dispense Auth. Provider   triamcinolone (KENALOG) 0.025 % cream Apply 1 application topically 2 (two) times daily. Do not use more than 7 days Patient not taking:  Reported on 01/05/2018 30 g Lorin Picket, PA-C     Controlled Substance Prescriptions [place] Controlled Substance Registry consulted? Not Applicable   Lorin Picket, PA-C 01/06/18 0712

## 2018-01-10 LAB — CYTOLOGY - PAP
DIAGNOSIS: NEGATIVE
HPV (WINDOPATH): NOT DETECTED

## 2018-01-13 ENCOUNTER — Other Ambulatory Visit: Payer: Self-pay | Admitting: Obstetrics and Gynecology

## 2018-01-13 DIAGNOSIS — R350 Frequency of micturition: Secondary | ICD-10-CM

## 2018-02-09 DIAGNOSIS — N393 Stress incontinence (female) (male): Secondary | ICD-10-CM | POA: Diagnosis not present

## 2018-02-09 DIAGNOSIS — N3281 Overactive bladder: Secondary | ICD-10-CM | POA: Diagnosis not present

## 2018-02-09 DIAGNOSIS — N3941 Urge incontinence: Secondary | ICD-10-CM | POA: Diagnosis not present

## 2018-02-09 DIAGNOSIS — R3915 Urgency of urination: Secondary | ICD-10-CM | POA: Diagnosis not present

## 2018-02-24 ENCOUNTER — Encounter: Payer: Self-pay | Admitting: Internal Medicine

## 2018-02-24 ENCOUNTER — Other Ambulatory Visit: Payer: Self-pay

## 2018-02-24 ENCOUNTER — Ambulatory Visit (INDEPENDENT_AMBULATORY_CARE_PROVIDER_SITE_OTHER): Payer: BLUE CROSS/BLUE SHIELD | Admitting: Internal Medicine

## 2018-02-24 VITALS — BP 104/70 | HR 72 | Ht 69.0 in | Wt 221.0 lb

## 2018-02-24 DIAGNOSIS — Z1231 Encounter for screening mammogram for malignant neoplasm of breast: Secondary | ICD-10-CM | POA: Diagnosis not present

## 2018-02-24 DIAGNOSIS — G43009 Migraine without aura, not intractable, without status migrainosus: Secondary | ICD-10-CM | POA: Diagnosis not present

## 2018-02-24 DIAGNOSIS — N3281 Overactive bladder: Secondary | ICD-10-CM

## 2018-02-24 DIAGNOSIS — F258 Other schizoaffective disorders: Secondary | ICD-10-CM | POA: Diagnosis not present

## 2018-02-24 DIAGNOSIS — F259 Schizoaffective disorder, unspecified: Secondary | ICD-10-CM

## 2018-02-24 DIAGNOSIS — E785 Hyperlipidemia, unspecified: Secondary | ICD-10-CM

## 2018-02-24 DIAGNOSIS — Z0000 Encounter for general adult medical examination without abnormal findings: Secondary | ICD-10-CM | POA: Diagnosis not present

## 2018-02-24 LAB — POCT URINALYSIS DIPSTICK
BILIRUBIN UA: NEGATIVE
Glucose, UA: NEGATIVE
Ketones, UA: NEGATIVE
Leukocytes, UA: NEGATIVE
NITRITE UA: NEGATIVE
PH UA: 6 (ref 5.0–8.0)
PROTEIN UA: NEGATIVE
RBC UA: NEGATIVE
SPEC GRAV UA: 1.01 (ref 1.010–1.025)
Urobilinogen, UA: 0.2 E.U./dL

## 2018-02-24 NOTE — Progress Notes (Signed)
Date:  02/24/2018   Name:  CALVIN CHURA   DOB:  10-Jan-1971   MRN:  [id number]   Chief Complaint: Annual Exam Beth Lewis is a 48 y.o. female who presents today for her Complete Annual Exam. She feels well. She reports exercising regularly. She reports she is sleeping fairly well. She recently had Pap with GYN.  Migraine   This is a recurrent problem. The problem occurs monthly. The problem has been unchanged. The pain quality is similar to prior headaches. Pertinent negatives include no abdominal pain, coughing, dizziness, fever, hearing loss, tinnitus or vomiting. She has tried triptans for the symptoms. The treatment provided significant relief.  Constipation  This is a chronic problem. Her stool frequency is 2 to 3 times per week. The stool is described as firm. Pertinent negatives include no abdominal pain, diarrhea, fever or vomiting.  UAB - now back on ditropan for 6 weeks and working well. Schizoaffective disorder - stable on current medications.  No longer seeing psych due to long standing stability of symptoms. Lab Results  Component Value Date   CHOL 197 02/23/2017   HDL 43 02/23/2017   LDLCALC 118 (H) 02/23/2017   TRIG 178 (H) 02/23/2017   CHOLHDL 4.6 (H) 02/23/2017   Lab Results  Component Value Date   CREATININE 0.93 02/23/2017   BUN 13 02/23/2017   NA 139 02/23/2017   K 5.1 02/23/2017   CL 101 02/23/2017   CO2 21 02/23/2017     Review of Systems  Constitutional: Negative for chills, fatigue and fever.  HENT: Negative for congestion, hearing loss, tinnitus, trouble swallowing and voice change.   Eyes: Negative for visual disturbance.  Respiratory: Negative for cough, chest tightness, shortness of breath and wheezing.   Cardiovascular: Negative for chest pain, palpitations and leg swelling.  Gastrointestinal: Negative for abdominal pain, constipation, diarrhea and vomiting.  Endocrine: Negative for polydipsia and polyuria.  Genitourinary: Negative  for dysuria, frequency, genital sores, vaginal bleeding and vaginal discharge.  Musculoskeletal: Positive for arthralgias (right knee strain). Negative for gait problem and joint swelling.  Skin: Negative for color change and rash.  Allergic/Immunologic: Negative for environmental allergies.  Neurological: Positive for headaches. Negative for dizziness, tremors and light-headedness.  Hematological: Negative for adenopathy. Does not bruise/bleed easily.  Psychiatric/Behavioral: Negative for dysphoric mood and sleep disturbance. The patient is not nervous/anxious.     Patient Active Problem List   Diagnosis Date Noted  . Urinary urgency 01/05/2018  . Urge incontinence of urine 01/05/2018  . Knee strain, right, initial encounter 02/20/2016  . Dyslipidemia 07/16/2014  . Migraine without aura and responsive to treatment 07/16/2014  . Chronic schizoaffective disorder ([place]) 07/16/2014    No Known Allergies  Past Surgical History:  Procedure Laterality Date  . BASAL CELL CARCINOMA EXCISION    . MOHS SURGERY  2012    Social History   Tobacco Use  . Smoking status: Never Smoker  . Smokeless tobacco: Never Used  Substance Use Topics  . Alcohol use: Yes    Alcohol/week: 0.0 standard drinks    Comment: occasional  . Drug use: No     Medication list has been reviewed and updated.  Current Meds  Medication Sig  . divalproex (DEPAKOTE ER) 500 MG 24 hr tablet TAKE 3 TABLETS DAILY  . LOW-OGESTREL 0.3-30 MG-MCG tablet TAKE 1 TABLET DAILY  . oxybutynin (DITROPAN-XL) 5 MG 24 hr tablet TAKE 1 TABLET(5 MG) BY MOUTH AT BEDTIME  . risperiDONE (RISPERDAL) 1 MG tablet  TAKE 1 TABLET AT BEDTIME  . triamcinolone (KENALOG) 0.025 % cream Apply 1 application topically 2 (two) times daily. Do not use more than 7 days  . zolmitriptan (ZOMIG-ZMT) 5 MG disintegrating tablet TAKE 1 TABLET BY MOUTH DAILY AS NEEDED    PHQ 2/9 Scores 02/24/2018 01/05/2018 02/23/2017 02/20/2016  PHQ - 2 Score 0 0 0 0  PHQ- 9  Score - 0 - -    Physical Exam Vitals signs and nursing note reviewed.  Constitutional:      General: She is not in acute distress.    Appearance: She is well-developed.  HENT:     Head: Normocephalic and atraumatic.     Right Ear: Tympanic membrane and ear canal normal.     Left Ear: Tympanic membrane and ear canal normal.     Nose:     Right Sinus: No maxillary sinus tenderness.     Left Sinus: No maxillary sinus tenderness.     Mouth/Throat:     Pharynx: Uvula midline.  Eyes:     General: No scleral icterus.       Right eye: No discharge.        Left eye: No discharge.     Conjunctiva/sclera: Conjunctivae normal.  Neck:     Musculoskeletal: Normal range of motion. No erythema.     Thyroid: No thyromegaly.     Vascular: No carotid bruit.  Cardiovascular:     Rate and Rhythm: Normal rate and regular rhythm.     Pulses: Normal pulses.     Heart sounds: Normal heart sounds.  Pulmonary:     Effort: Pulmonary effort is normal. No respiratory distress.     Breath sounds: No wheezing.  Abdominal:     General: Bowel sounds are normal.     Palpations: Abdomen is soft.     Tenderness: There is no abdominal tenderness.  Musculoskeletal: Normal range of motion.  Lymphadenopathy:     Cervical: No cervical adenopathy.  Skin:    General: Skin is warm and dry.     Findings: No rash.  Neurological:     Mental Status: She is alert and oriented to person, place, and time.     Cranial Nerves: No cranial nerve deficit.     Sensory: No sensory deficit.     Deep Tendon Reflexes: Reflexes are normal and symmetric.  Psychiatric:        Speech: Speech normal.        Behavior: Behavior normal.        Thought Content: Thought content normal.     BP 104/70   Pulse 72   Ht 5\' 9"  (1.753 m)   Wt 221 lb (100.2 kg)   SpO2 97%   BMI 32.64 kg/m   Assessment and Plan: 1. Annual physical exam Normal exam except for weight Continue regular exercise and healthy diet Try low dose Miralax  several times per week for constipation - CBC with Differential/Platelet - TSH - POCT urinalysis dipstick  2. Encounter for screening mammogram for breast cancer Schedule in April - MM 3D SCREEN BREAST BILATERAL; Future  3. Migraine without aura and responsive to treatment unchanged  4. Chronic schizoaffective disorder (HCC) Controlled on medications  5. Dyslipidemia - Comprehensive metabolic panel - Lipid panel  6. OAB (overactive bladder) Continue Ditropan   Partially dictated using Editor, commissioning. Any errors are unintentional.  Halina Maidens, MD [facility]  02/24/2018

## 2018-02-25 LAB — CBC WITH DIFFERENTIAL/PLATELET
BASOS ABS: 0 10*3/uL (ref 0.0–0.2)
BASOS: 1 %
EOS (ABSOLUTE): 0.1 10*3/uL (ref 0.0–0.4)
EOS: 1 %
HEMOGLOBIN: 13.8 g/dL (ref 11.1–15.9)
Hematocrit: 42.4 % (ref 34.0–46.6)
IMMATURE GRANS (ABS): 0 10*3/uL (ref 0.0–0.1)
Immature Granulocytes: 0 %
LYMPHS: 30 %
Lymphocytes Absolute: 2.5 10*3/uL (ref 0.7–3.1)
MCH: 31.5 pg (ref 26.6–33.0)
MCHC: 32.5 g/dL (ref 31.5–35.7)
MCV: 97 fL (ref 79–97)
MONOCYTES: 6 %
Monocytes Absolute: 0.4 10*3/uL (ref 0.1–0.9)
Neutrophils Absolute: 5 10*3/uL (ref 1.4–7.0)
Neutrophils: 62 %
Platelets: 397 10*3/uL (ref 150–450)
RBC: 4.38 x10E6/uL (ref 3.77–5.28)
RDW: 12.2 % (ref 11.7–15.4)
WBC: 8.1 10*3/uL (ref 3.4–10.8)

## 2018-02-25 LAB — COMPREHENSIVE METABOLIC PANEL
ALBUMIN: 4.1 g/dL (ref 3.8–4.8)
ALT: 12 IU/L (ref 0–32)
AST: 12 IU/L (ref 0–40)
Albumin/Globulin Ratio: 1.5 (ref 1.2–2.2)
Alkaline Phosphatase: 76 IU/L (ref 39–117)
BILIRUBIN TOTAL: 0.3 mg/dL (ref 0.0–1.2)
BUN / CREAT RATIO: 16 (ref 9–23)
BUN: 13 mg/dL (ref 6–24)
CALCIUM: 9.5 mg/dL (ref 8.7–10.2)
CO2: 21 mmol/L (ref 20–29)
CREATININE: 0.83 mg/dL (ref 0.57–1.00)
Chloride: 100 mmol/L (ref 96–106)
GFR calc Af Amer: 98 mL/min/{1.73_m2} (ref >59)
GFR, EST NON AFRICAN AMERICAN: 85 mL/min/{1.73_m2} (ref >59)
GLUCOSE: 77 mg/dL (ref 65–99)
Globulin, Total: 2.7 g/dL (ref 1.5–4.5)
Potassium: 4.8 mmol/L (ref 3.5–5.2)
Sodium: 137 mmol/L (ref 134–144)
TOTAL PROTEIN: 6.8 g/dL (ref 6.0–8.5)

## 2018-02-25 LAB — LIPID PANEL
CHOL/HDL RATIO: 4.5 ratio — AB (ref 0.0–4.4)
Cholesterol, Total: 179 mg/dL (ref 100–199)
HDL: 40 mg/dL (ref >39)
LDL CALC: 111 mg/dL — AB (ref 0–99)
Triglycerides: 141 mg/dL (ref 0–149)
VLDL CHOLESTEROL CAL: 28 mg/dL (ref 5–40)

## 2018-02-25 LAB — TSH: TSH: 4.15 u[IU]/mL (ref 0.450–4.500)

## 2018-06-29 ENCOUNTER — Other Ambulatory Visit: Payer: Self-pay | Admitting: Internal Medicine

## 2018-10-13 DIAGNOSIS — Z20828 Contact with and (suspected) exposure to other viral communicable diseases: Secondary | ICD-10-CM | POA: Diagnosis not present

## 2018-11-10 DIAGNOSIS — D2262 Melanocytic nevi of left upper limb, including shoulder: Secondary | ICD-10-CM | POA: Diagnosis not present

## 2018-11-10 DIAGNOSIS — L570 Actinic keratosis: Secondary | ICD-10-CM | POA: Diagnosis not present

## 2018-11-10 DIAGNOSIS — D2271 Melanocytic nevi of right lower limb, including hip: Secondary | ICD-10-CM | POA: Diagnosis not present

## 2018-11-10 DIAGNOSIS — D2261 Melanocytic nevi of right upper limb, including shoulder: Secondary | ICD-10-CM | POA: Diagnosis not present

## 2018-11-10 DIAGNOSIS — Z85828 Personal history of other malignant neoplasm of skin: Secondary | ICD-10-CM | POA: Diagnosis not present

## 2018-11-10 DIAGNOSIS — X32XXXA Exposure to sunlight, initial encounter: Secondary | ICD-10-CM | POA: Diagnosis not present

## 2018-12-01 ENCOUNTER — Other Ambulatory Visit: Payer: Self-pay | Admitting: Internal Medicine

## 2018-12-01 DIAGNOSIS — F259 Schizoaffective disorder, unspecified: Secondary | ICD-10-CM

## 2019-01-24 ENCOUNTER — Ambulatory Visit (INDEPENDENT_AMBULATORY_CARE_PROVIDER_SITE_OTHER): Payer: BC Managed Care – PPO | Admitting: Internal Medicine

## 2019-01-24 ENCOUNTER — Other Ambulatory Visit: Payer: Self-pay

## 2019-01-24 ENCOUNTER — Encounter: Payer: Self-pay | Admitting: Internal Medicine

## 2019-01-24 VITALS — BP 124/70 | HR 88 | Ht 69.0 in | Wt 229.0 lb

## 2019-01-24 DIAGNOSIS — N898 Other specified noninflammatory disorders of vagina: Secondary | ICD-10-CM | POA: Diagnosis not present

## 2019-01-24 DIAGNOSIS — F259 Schizoaffective disorder, unspecified: Secondary | ICD-10-CM | POA: Diagnosis not present

## 2019-01-24 LAB — POCT WET PREP WITH KOH
KOH Prep POC: NEGATIVE
RBC Wet Prep HPF POC: 0
Trichomonas, UA: NEGATIVE
Yeast Wet Prep HPF POC: 0

## 2019-01-24 MED ORDER — METRONIDAZOLE 500 MG PO TABS: 500.0000 mg | ORAL_TABLET | Freq: Two times a day (BID) | ORAL | 0 refills | Status: AC

## 2019-01-24 NOTE — Progress Notes (Signed)
Date:  01/24/2019   Name:  Beth Lewis   DOB:  1970/08/09   MRN:  [id number]   Chief Complaint: Vaginal Discharge (Discharge and burning started over the weekend. Using triamcimilone cream. Feeling a little better today.  No itching. Burning without voiding. )  Vaginal Discharge The patient's primary symptoms include genital itching and vaginal discharge. This is a new problem. The current episode started in the past 7 days. The problem occurs constantly. The problem has been gradually improving. The patient is experiencing no pain. The problem affects both sides. She is not pregnant. Associated symptoms include urgency. Pertinent negatives include no chills, dysuria, fever or frequency.    Lab Results  Component Value Date   CREATININE 0.83 02/24/2018   BUN 13 02/24/2018   NA 137 02/24/2018   K 4.8 02/24/2018   CL 100 02/24/2018   CO2 21 02/24/2018   Lab Results  Component Value Date   CHOL 179 02/24/2018   HDL 40 02/24/2018   LDLCALC 111 (H) 02/24/2018   TRIG 141 02/24/2018   CHOLHDL 4.5 (H) 02/24/2018   Lab Results  Component Value Date   TSH 4.150 02/24/2018   Lab Results  Component Value Date   HGBA1C 5.2 02/23/2017     Review of Systems  Constitutional: Negative for chills and fever.  Genitourinary: Positive for urgency and vaginal discharge. Negative for dysuria, frequency and menstrual problem.    Patient Active Problem List   Diagnosis Date Noted  . OAB (overactive bladder) 01/05/2018  . Urge incontinence of urine 01/05/2018  . Knee strain, right, initial encounter 02/20/2016  . Dyslipidemia 07/16/2014  . Migraine without aura and responsive to treatment 07/16/2014  . Chronic schizoaffective disorder ([place]) 07/16/2014    No Known Allergies  Past Surgical History:  Procedure Laterality Date  . BASAL CELL CARCINOMA EXCISION    . MOHS SURGERY  2012    Social History   Tobacco Use  . Smoking status: Never Smoker  . Smokeless tobacco: Never  Used  Substance Use Topics  . Alcohol use: Yes    Alcohol/week: 0.0 standard drinks    Comment: occasional  . Drug use: No     Medication list has been reviewed and updated.  Current Meds  Medication Sig  . divalproex (DEPAKOTE ER) 500 MG 24 hr tablet TAKE 3 TABLETS DAILY  . LOW-OGESTREL 0.3-30 MG-MCG tablet TAKE 1 TABLET DAILY  . oxybutynin (DITROPAN-XL) 5 MG 24 hr tablet TAKE 1 TABLET(5 MG) BY MOUTH AT BEDTIME  . risperiDONE (RISPERDAL) 1 MG tablet TAKE 1 TABLET AT BEDTIME  . triamcinolone (KENALOG) 0.025 % cream Apply 1 application topically 2 (two) times daily. Do not use more than 7 days  . zolmitriptan (ZOMIG-ZMT) 5 MG disintegrating tablet TAKE 1 TABLET BY MOUTH DAILY AS NEEDED    PHQ 2/9 Scores 01/24/2019 02/24/2018 01/05/2018 02/23/2017  PHQ - 2 Score 0 0 0 0  PHQ- 9 Score - - 0 -    BP Readings from Last 3 Encounters:  01/24/19 124/70  02/24/18 104/70  01/05/18 133/74    Physical Exam Vitals and nursing note reviewed.  Constitutional:      General: She is not in acute distress.    Appearance: She is well-developed.  HENT:     Head: Normocephalic and atraumatic.  Cardiovascular:     Rate and Rhythm: Normal rate and regular rhythm.  Pulmonary:     Effort: Pulmonary effort is normal. No respiratory distress.  Breath sounds: No wheezing or rhonchi.  Abdominal:     Palpations: Abdomen is soft.     Tenderness: There is no abdominal tenderness. There is no guarding or rebound.  Musculoskeletal:        General: Normal range of motion.  Skin:    General: Skin is warm and dry.     Findings: No rash.  Neurological:     Mental Status: She is alert and oriented to person, place, and time.  Psychiatric:        Behavior: Behavior normal.        Thought Content: Thought content normal.     Wt Readings from Last 3 Encounters:  01/24/19 229 lb (103.9 kg)  02/24/18 221 lb (100.2 kg)  01/05/18 222 lb (100.7 kg)    BP 124/70   Pulse 88   Ht 5\' 9"  (1.753 m)   Wt  229 lb (103.9 kg)   SpO2 98%   BMI 33.82 kg/m   Assessment and Plan: 1. Vaginal discharge Recommend flagyl bid x 7 days No need to treat sexual partner - metroNIDAZOLE (FLAGYL) 500 MG tablet; Take 1 tablet (500 mg total) by mouth 2 (two) times daily for 7 days.  Dispense: 14 tablet; Refill: 0 - POCT Wet Prep with KOH  2. Chronic schizoaffective disorder ([place]) Clinically stable on current medicaitons   Partially dictated using Editor, commissioning. Any errors are unintentional.  Halina Maidens, MD [facility]  01/24/2019

## 2019-02-07 ENCOUNTER — Other Ambulatory Visit: Payer: Self-pay | Admitting: Obstetrics and Gynecology

## 2019-02-07 DIAGNOSIS — R350 Frequency of micturition: Secondary | ICD-10-CM

## 2019-03-01 ENCOUNTER — Encounter: Payer: Self-pay | Admitting: Internal Medicine

## 2019-03-01 ENCOUNTER — Ambulatory Visit (INDEPENDENT_AMBULATORY_CARE_PROVIDER_SITE_OTHER): Payer: BC Managed Care – PPO | Admitting: Internal Medicine

## 2019-03-01 ENCOUNTER — Other Ambulatory Visit: Payer: Self-pay

## 2019-03-01 VITALS — BP 128/78 | HR 88 | Temp 97.5°F | Ht 69.0 in | Wt 229.0 lb

## 2019-03-01 DIAGNOSIS — Z1231 Encounter for screening mammogram for malignant neoplasm of breast: Secondary | ICD-10-CM | POA: Diagnosis not present

## 2019-03-01 DIAGNOSIS — F259 Schizoaffective disorder, unspecified: Secondary | ICD-10-CM | POA: Diagnosis not present

## 2019-03-01 DIAGNOSIS — Z0000 Encounter for general adult medical examination without abnormal findings: Secondary | ICD-10-CM | POA: Diagnosis not present

## 2019-03-01 DIAGNOSIS — G43009 Migraine without aura, not intractable, without status migrainosus: Secondary | ICD-10-CM

## 2019-03-01 DIAGNOSIS — E785 Hyperlipidemia, unspecified: Secondary | ICD-10-CM

## 2019-03-01 LAB — POCT URINALYSIS DIPSTICK
Bilirubin, UA: NEGATIVE
Blood, UA: NEGATIVE
Glucose, UA: NEGATIVE
Ketones, UA: NEGATIVE
Leukocytes, UA: NEGATIVE
Nitrite, UA: NEGATIVE
Protein, UA: NEGATIVE
Spec Grav, UA: 1.015 (ref 1.010–1.025)
Urobilinogen, UA: 0.2 E.U./dL
pH, UA: 6 (ref 5.0–8.0)

## 2019-03-01 NOTE — Patient Instructions (Signed)
Covid Vaccine locations: [employer] [facility]. [place]  [email or web address]

## 2019-03-01 NOTE — Progress Notes (Signed)
Date:  03/01/2019   Name:  Beth Lewis   DOB:  02-Oct-1970   MRN:  [id number]   Chief Complaint: Annual Exam (Breast exam. Pap in 2 more years. ) Beth Lewis is a 49 y.o. female who presents today for her Complete Annual Exam. She feels well. She reports exercising some. She reports she is sleeping fairly well. Menses are regular.  She denies breast issues.  Mammogram - overdue Pap 12/2017 - negative with cotesting Immunization History  Administered Date(s) Administered  . Influenza Inj Mdck Quad With Preservative 11/04/2017  . Influenza,inj,Quad PF,6+ Mos 01/04/2017, 11/01/2018  . Tdap 02/20/2016    Migraine  This is a recurrent problem. The problem occurs monthly. The problem has been gradually worsening. The pain quality is similar to prior headaches. Pertinent negatives include no abdominal pain, coughing, dizziness, fever, hearing loss, tinnitus or vomiting. She has tried triptans for the symptoms. The treatment provided significant relief.  Shizo-affective disorder - clinically doing well on current medications.  No recent changes in mood or affect.  She is moving houses and excited about that.  She is no longer seeing Psych since her medications have been unchanged for several years.  Lab Results  Component Value Date   CREATININE 0.83 02/24/2018   BUN 13 02/24/2018   NA 137 02/24/2018   K 4.8 02/24/2018   CL 100 02/24/2018   CO2 21 02/24/2018   Lab Results  Component Value Date   CHOL 179 02/24/2018   HDL 40 02/24/2018   LDLCALC 111 (H) 02/24/2018   TRIG 141 02/24/2018   CHOLHDL 4.5 (H) 02/24/2018   Lab Results  Component Value Date   TSH 4.150 02/24/2018   Lab Results  Component Value Date   HGBA1C 5.2 02/23/2017     Review of Systems  Constitutional: Negative for chills, fatigue and fever.  HENT: Negative for congestion, hearing loss, tinnitus, trouble swallowing and voice change.   Eyes: Negative for visual disturbance.  Respiratory:  Negative for cough, chest tightness, shortness of breath and wheezing.   Cardiovascular: Negative for chest pain, palpitations and leg swelling.  Gastrointestinal: Negative for abdominal pain, constipation, diarrhea and vomiting.  Endocrine: Negative for polydipsia and polyuria.  Genitourinary: Negative for dysuria, frequency, genital sores, vaginal bleeding and vaginal discharge.  Musculoskeletal: Negative for arthralgias, gait problem and joint swelling.  Skin: Negative for color change and rash.  Neurological: Positive for headaches. Negative for dizziness, tremors and light-headedness.  Hematological: Negative for adenopathy. Does not bruise/bleed easily.  Psychiatric/Behavioral: Negative for dysphoric mood and sleep disturbance. The patient is not nervous/anxious.     Patient Active Problem List   Diagnosis Date Noted  . OAB (overactive bladder) 01/05/2018  . Urge incontinence of urine 01/05/2018  . Knee strain, right, initial encounter 02/20/2016  . Dyslipidemia 07/16/2014  . Migraine without aura and responsive to treatment 07/16/2014  . Chronic schizoaffective disorder ([place]) 07/16/2014    No Known Allergies  Past Surgical History:  Procedure Laterality Date  . BASAL CELL CARCINOMA EXCISION    . MOHS SURGERY  2012    Social History   Tobacco Use  . Smoking status: Never Smoker  . Smokeless tobacco: Never Used  Substance Use Topics  . Alcohol use: Yes    Alcohol/week: 0.0 standard drinks    Comment: occasional  . Drug use: No     Medication list has been reviewed and updated.  Current Meds  Medication Sig  . divalproex (DEPAKOTE ER) 500 MG  24 hr tablet TAKE 3 TABLETS DAILY  . LOW-OGESTREL 0.3-30 MG-MCG tablet TAKE 1 TABLET DAILY  . oxybutynin (DITROPAN-XL) 5 MG 24 hr tablet TAKE 1 TABLET(5 MG) BY MOUTH AT BEDTIME  . risperiDONE (RISPERDAL) 1 MG tablet TAKE 1 TABLET AT BEDTIME  . triamcinolone (KENALOG) 0.025 % cream Apply 1 application topically 2 (two) times  daily. Do not use more than 7 days  . zolmitriptan (ZOMIG-ZMT) 5 MG disintegrating tablet TAKE 1 TABLET BY MOUTH DAILY AS NEEDED    PHQ 2/9 Scores 03/01/2019 01/24/2019 02/24/2018 01/05/2018  PHQ - 2 Score 0 0 0 0  PHQ- 9 Score 0 - - 0    BP Readings from Last 3 Encounters:  03/01/19 128/78  01/24/19 124/70  02/24/18 104/70    Physical Exam Vitals and nursing note reviewed.  Constitutional:      General: She is not in acute distress.    Appearance: She is well-developed.  HENT:     Head: Normocephalic and atraumatic.     Right Ear: Tympanic membrane and ear canal normal.     Left Ear: Tympanic membrane and ear canal normal.     Nose:     Right Sinus: No maxillary sinus tenderness.     Left Sinus: No maxillary sinus tenderness.  Eyes:     General: No scleral icterus.       Right eye: No discharge.        Left eye: No discharge.     Conjunctiva/sclera: Conjunctivae normal.  Neck:     Thyroid: No thyromegaly.     Vascular: No carotid bruit.  Cardiovascular:     Rate and Rhythm: Normal rate and regular rhythm.     Pulses: Normal pulses.     Heart sounds: Normal heart sounds.  Pulmonary:     Effort: Pulmonary effort is normal. No respiratory distress.     Breath sounds: No wheezing.  Chest:     Breasts:        Right: No mass, nipple discharge, skin change or tenderness.        Left: No mass, nipple discharge, skin change or tenderness.  Abdominal:     General: Bowel sounds are normal.     Palpations: Abdomen is soft.     Tenderness: There is no abdominal tenderness.  Musculoskeletal:     Cervical back: Normal range of motion. No erythema.     Right lower leg: No edema.     Left lower leg: No edema.  Lymphadenopathy:     Cervical: No cervical adenopathy.  Skin:    General: Skin is warm and dry.     Capillary Refill: Capillary refill takes less than 2 seconds.     Findings: No rash.  Neurological:     General: No focal deficit present.     Mental Status: She is alert  and oriented to person, place, and time.     Cranial Nerves: No cranial nerve deficit.     Sensory: No sensory deficit.     Deep Tendon Reflexes: Reflexes are normal and symmetric.  Psychiatric:        Speech: Speech normal.        Behavior: Behavior normal.        Thought Content: Thought content normal.     Wt Readings from Last 3 Encounters:  03/01/19 229 lb (103.9 kg)  01/24/19 229 lb (103.9 kg)  02/24/18 221 lb (100.2 kg)    BP 128/78   Pulse 88   Temp Marland Kitchen)  97.5 F (36.4 C) (Oral)   Ht 5\' 9"  (1.753 m)   Wt 229 lb (103.9 kg)   SpO2 96%   BMI 33.82 kg/m   Assessment and Plan: 1. Annual physical exam Normal exam except for weight Continue to work on diet and regular exercise - CBC with Differential/Platelet - Comprehensive metabolic panel - TSH - POCT urinalysis dipstick - large leuks but micro shows many epithelial cells indicating poor clean catch technique  2. Migraine without aura and responsive to treatment Migraines are unchanged but slightly more frequent - 1-2 times per month They still respond well to Zomig but pt often tries to take Aleve instead Encourage her to use the triptans for more complete resolution  3. Chronic schizoaffective disorder (HCC) Stable, doing well on Risperidone and Depakote  4. Dyslipidemia Check labs and advise - Lipid panel  5. Encounter for screening mammogram for breast cancer To be scheduled at [place]; Future   Partially dictated using Editor, commissioning. Any errors are unintentional.  Halina Maidens, MD [facility]  03/01/2019

## 2019-03-02 LAB — COMPREHENSIVE METABOLIC PANEL
ALT: 14 IU/L (ref 0–32)
AST: 12 IU/L (ref 0–40)
Albumin/Globulin Ratio: 1.6 (ref 1.2–2.2)
Albumin: 4.2 g/dL (ref 3.8–4.8)
Alkaline Phosphatase: 86 IU/L (ref 39–117)
BUN/Creatinine Ratio: 14 (ref 9–23)
BUN: 11 mg/dL (ref 6–24)
Bilirubin Total: 0.3 mg/dL (ref 0.0–1.2)
CO2: 22 mmol/L (ref 20–29)
Calcium: 9.6 mg/dL (ref 8.7–10.2)
Chloride: 104 mmol/L (ref 96–106)
Creatinine, Ser: 0.8 mg/dL (ref 0.57–1.00)
GFR calc Af Amer: 102 mL/min/{1.73_m2} (ref >59)
GFR calc non Af Amer: 88 mL/min/{1.73_m2} (ref >59)
Globulin, Total: 2.7 g/dL (ref 1.5–4.5)
Glucose: 82 mg/dL (ref 65–99)
Potassium: 4.7 mmol/L (ref 3.5–5.2)
Sodium: 140 mmol/L (ref 134–144)
Total Protein: 6.9 g/dL (ref 6.0–8.5)

## 2019-03-02 LAB — CBC WITH DIFFERENTIAL/PLATELET
Basophils Absolute: 0.1 10*3/uL (ref 0.0–0.2)
Basos: 1 %
EOS (ABSOLUTE): 0.1 10*3/uL (ref 0.0–0.4)
Eos: 2 %
Hematocrit: 44.1 % (ref 34.0–46.6)
Hemoglobin: 14.4 g/dL (ref 11.1–15.9)
Immature Grans (Abs): 0 10*3/uL (ref 0.0–0.1)
Immature Granulocytes: 0 %
Lymphocytes Absolute: 2.7 10*3/uL (ref 0.7–3.1)
Lymphs: 31 %
MCH: 30.7 pg (ref 26.6–33.0)
MCHC: 32.7 g/dL (ref 31.5–35.7)
MCV: 94 fL (ref 79–97)
Monocytes Absolute: 0.5 10*3/uL (ref 0.1–0.9)
Monocytes: 6 %
Neutrophils Absolute: 5.3 10*3/uL (ref 1.4–7.0)
Neutrophils: 60 %
Platelets: 426 10*3/uL (ref 150–450)
RBC: 4.69 x10E6/uL (ref 3.77–5.28)
RDW: 13.1 % (ref 11.7–15.4)
WBC: 8.7 10*3/uL (ref 3.4–10.8)

## 2019-03-02 LAB — TSH: TSH: 3.08 u[IU]/mL (ref 0.450–4.500)

## 2019-03-02 LAB — LIPID PANEL
Chol/HDL Ratio: 4.7 ratio — ABNORMAL HIGH (ref 0.0–4.4)
Cholesterol, Total: 186 mg/dL (ref 100–199)
HDL: 40 mg/dL (ref >39)
LDL Chol Calc (NIH): 120 mg/dL — ABNORMAL HIGH (ref 0–99)
Triglycerides: 143 mg/dL (ref 0–149)
VLDL Cholesterol Cal: 26 mg/dL (ref 5–40)

## 2019-03-17 ENCOUNTER — Other Ambulatory Visit: Payer: Self-pay

## 2019-03-17 DIAGNOSIS — R350 Frequency of micturition: Secondary | ICD-10-CM

## 2019-03-17 MED ORDER — OXYBUTYNIN CHLORIDE ER 5 MG PO TB24: ORAL_TABLET | ORAL | 1 refills | Status: DC

## 2019-03-23 ENCOUNTER — Ambulatory Visit
Admission: RE | Admit: 2019-03-23 | Discharge: 2019-03-23 | Disposition: A | Discharge status: Home / Self Care | Payer: BC Managed Care – PPO | Source: Ambulatory Visit | Attending: Internal Medicine | Admitting: Internal Medicine

## 2019-03-23 ENCOUNTER — Other Ambulatory Visit: Payer: Self-pay

## 2019-03-23 DIAGNOSIS — Z1231 Encounter for screening mammogram for malignant neoplasm of breast: Secondary | ICD-10-CM

## 2019-04-01 ENCOUNTER — Other Ambulatory Visit: Payer: Self-pay | Admitting: Internal Medicine

## 2019-05-08 ENCOUNTER — Other Ambulatory Visit: Payer: Self-pay

## 2019-05-08 MED ORDER — DIVALPROEX SODIUM ER 500 MG PO TB24: 1500.0000 mg | ORAL_TABLET | Freq: Every day | ORAL | 1 refills | Status: DC

## 2019-05-08 MED ORDER — ZOLMITRIPTAN 5 MG PO TBDP: 5.0000 mg | ORAL_TABLET | Freq: Every day | ORAL | 5 refills | Status: DC | PRN

## 2019-05-31 ENCOUNTER — Other Ambulatory Visit: Payer: Self-pay | Admitting: Internal Medicine

## 2019-05-31 NOTE — Telephone Encounter (Signed)
Requested Prescriptions  Pending Prescriptions Disp Refills  . LOW-OGESTREL 0.3-30 MG-MCG tablet [Pharmacy Med Name: NORGEST/ETH ES(LOW-OGESTREL)TAB 28S 0.3MG /30MCG] 84 tablet 2    Sig: TAKE 1 TABLET DAILY     OB/GYN:  Contraceptives Passed - 05/31/2019 12:09 AM      Passed - Last BP in normal range    BP Readings from Last 1 Encounters:  03/01/19 128/78         Passed - Valid encounter within last 12 months    Recent Outpatient Visits          3 months ago Annual physical exam   [facility] [name], MD   4 months ago Vaginal discharge   [facility] [name], MD   1 year ago Annual physical exam   [facility] [name], MD   2 years ago Annual physical exam   [facility] [name], MD   2 years ago OAB (overactive bladder)   [facility] [name], MD

## 2019-08-22 DIAGNOSIS — S0086XA Insect bite (nonvenomous) of other part of head, initial encounter: Secondary | ICD-10-CM | POA: Diagnosis not present

## 2019-08-22 DIAGNOSIS — Z85828 Personal history of other malignant neoplasm of skin: Secondary | ICD-10-CM | POA: Diagnosis not present

## 2019-08-22 DIAGNOSIS — S40861A Insect bite (nonvenomous) of right upper arm, initial encounter: Secondary | ICD-10-CM | POA: Diagnosis not present

## 2019-08-22 DIAGNOSIS — D225 Melanocytic nevi of trunk: Secondary | ICD-10-CM | POA: Diagnosis not present

## 2019-09-06 ENCOUNTER — Other Ambulatory Visit: Payer: Self-pay

## 2019-09-06 DIAGNOSIS — R350 Frequency of micturition: Secondary | ICD-10-CM

## 2019-09-06 MED ORDER — OXYBUTYNIN CHLORIDE ER 5 MG PO TB24: ORAL_TABLET | ORAL | 1 refills | Status: DC

## 2019-11-27 ENCOUNTER — Telehealth: Payer: Self-pay | Admitting: Internal Medicine

## 2019-11-27 DIAGNOSIS — F259 Schizoaffective disorder, unspecified: Secondary | ICD-10-CM

## 2019-11-27 NOTE — Telephone Encounter (Signed)
Refill request for Risperidone last refilled 08/28/19; no valid encounter within last 6 months; no upcoming visits noted; pt notified; the pt states she has been taking the medication for years and she sees Dr Army Melia every February; decision tree completed; pt offered and accepted appt with Dr Army Melia, [place], 03/12/19 at 0800; she verbalized understanding  Requested medication (s) are due for refill today: yes  Requested medication (s) are on the active medication list: yes  Last refill: 08/28/19  Future visit scheduled:   Notes to clinic: not delegated

## 2020-02-07 ENCOUNTER — Other Ambulatory Visit: Payer: Self-pay | Admitting: Internal Medicine

## 2020-03-11 ENCOUNTER — Ambulatory Visit (INDEPENDENT_AMBULATORY_CARE_PROVIDER_SITE_OTHER): Payer: BC Managed Care – PPO | Admitting: Internal Medicine

## 2020-03-11 ENCOUNTER — Encounter: Payer: Self-pay | Admitting: Internal Medicine

## 2020-03-11 ENCOUNTER — Other Ambulatory Visit: Payer: Self-pay

## 2020-03-11 VITALS — BP 104/62 | HR 84 | Temp 97.6°F | Ht 69.0 in | Wt 218.0 lb

## 2020-03-11 DIAGNOSIS — F259 Schizoaffective disorder, unspecified: Secondary | ICD-10-CM

## 2020-03-11 DIAGNOSIS — G43009 Migraine without aura, not intractable, without status migrainosus: Secondary | ICD-10-CM | POA: Diagnosis not present

## 2020-03-11 DIAGNOSIS — Z1159 Encounter for screening for other viral diseases: Secondary | ICD-10-CM

## 2020-03-11 DIAGNOSIS — Z1231 Encounter for screening mammogram for malignant neoplasm of breast: Secondary | ICD-10-CM

## 2020-03-11 DIAGNOSIS — N3281 Overactive bladder: Secondary | ICD-10-CM | POA: Diagnosis not present

## 2020-03-11 DIAGNOSIS — Z0000 Encounter for general adult medical examination without abnormal findings: Secondary | ICD-10-CM | POA: Diagnosis not present

## 2020-03-11 MED ORDER — DIVALPROEX SODIUM ER 500 MG PO TB24: 1500.0000 mg | ORAL_TABLET | Freq: Every day | ORAL | 3 refills | Status: DC

## 2020-03-11 MED ORDER — OXYBUTYNIN CHLORIDE ER 5 MG PO TB24: ORAL_TABLET | ORAL | 3 refills | Status: DC

## 2020-03-11 MED ORDER — ZOLMITRIPTAN 5 MG PO TBDP: 5.0000 mg | ORAL_TABLET | Freq: Every day | ORAL | 3 refills | Status: DC | PRN

## 2020-03-11 NOTE — Progress Notes (Signed)
Date:  03/11/2020   Name:  Beth Lewis   DOB:  Mar 11, 1970   MRN:  [id number]   Chief Complaint: Annual Exam (Breast exam no pap)  Beth Lewis is a 50 y.o. female who presents today for her Complete Annual Exam. She feels well. She reports exercising none. She reports she is sleeping well. Breast complaints none.  Mammogram: 03/2019 Pap smear: 12/2017 neg with cotesting Colonoscopy: none  Immunization History  Administered Date(s) Administered  . Influenza Inj Mdck Quad With Preservative 11/04/2017  . Influenza,inj,Quad PF,6+ Mos 01/04/2017, 11/01/2018  . Tdap 02/20/2016    Depression        This is a chronic problem.The problem is unchanged.  Associated symptoms include headaches.  Associated symptoms include no decreased concentration, no fatigue, not irritable, no restlessness and no suicidal ideas.     The symptoms are aggravated by nothing.  Treatments tried: risperdol and valproate.  Compliance with treatment is good.  Previous treatment provided significant relief. Migraine  This is a recurrent problem. The problem occurs monthly. The pain quality is similar to prior headaches. The pain is mild. Associated symptoms include scalp tenderness. Pertinent negatives include no abdominal pain, coughing, dizziness, fever, hearing loss, photophobia, tinnitus or vomiting. Back pain: knee pain. She has tried triptans for the symptoms. The treatment provided significant relief.    Lab Results  Component Value Date   CREATININE 0.80 03/01/2019   BUN 11 03/01/2019   NA 140 03/01/2019   K 4.7 03/01/2019   CL 104 03/01/2019   CO2 22 03/01/2019   Lab Results  Component Value Date   CHOL 186 03/01/2019   HDL 40 03/01/2019   LDLCALC 120 (H) 03/01/2019   TRIG 143 03/01/2019   CHOLHDL 4.7 (H) 03/01/2019   Lab Results  Component Value Date   TSH 3.080 03/01/2019   Lab Results  Component Value Date   HGBA1C 5.2 02/23/2017   Lab Results  Component Value Date   WBC  8.7 03/01/2019   HGB 14.4 03/01/2019   HCT 44.1 03/01/2019   MCV 94 03/01/2019   PLT 426 03/01/2019   Lab Results  Component Value Date   ALT 14 03/01/2019   AST 12 03/01/2019   ALKPHOS 86 03/01/2019   BILITOT 0.3 03/01/2019     Review of Systems  Constitutional: Negative for chills, fatigue, fever and unexpected weight change.  HENT: Negative for congestion, hearing loss, tinnitus, trouble swallowing and voice change.   Eyes: Negative for photophobia and visual disturbance.  Respiratory: Negative for cough, chest tightness, shortness of breath and wheezing.   Cardiovascular: Negative for chest pain, palpitations and leg swelling.  Gastrointestinal: Negative for abdominal pain, constipation, diarrhea and vomiting.  Endocrine: Negative for polydipsia and polyuria.  Genitourinary: Positive for menstrual problem (spotting). Negative for dysuria, frequency, genital sores, vaginal bleeding and vaginal discharge.  Musculoskeletal: Positive for arthralgias. Negative for gait problem and joint swelling. Back pain: knee pain.  Skin: Negative for color change and rash.  Neurological: Positive for headaches. Negative for dizziness, tremors and light-headedness.  Hematological: Negative for adenopathy. Does not bruise/bleed easily.  Psychiatric/Behavioral: Positive for depression. Negative for decreased concentration, dysphoric mood, hallucinations, sleep disturbance and suicidal ideas. The patient is not nervous/anxious.     Patient Active Problem List   Diagnosis Date Noted  . OAB (overactive bladder) 01/05/2018  . Urge incontinence of urine 01/05/2018  . Dyslipidemia 07/16/2014  . Migraine without aura and responsive to treatment 07/16/2014  . Chronic  schizoaffective disorder ([place]) 07/16/2014    No Known Allergies  Past Surgical History:  Procedure Laterality Date  . BASAL CELL CARCINOMA EXCISION    . MOHS SURGERY  2012    Social History   Tobacco Use  . Smoking status:  Never Smoker  . Smokeless tobacco: Never Used  Vaping Use  . Vaping Use: Never used  Substance Use Topics  . Alcohol use: Yes    Alcohol/week: 0.0 standard drinks    Comment: occasional  . Drug use: No     Medication list has been reviewed and updated.  Current Meds  Medication Sig  . divalproex (DEPAKOTE ER) 500 MG 24 hr tablet Take 3 tablets (1,500 mg total) by mouth daily.  Marland Kitchen FIBER PO Take by mouth.  . LOW-OGESTREL 0.3-30 MG-MCG tablet TAKE 1 TABLET DAILY  . oxybutynin (DITROPAN-XL) 5 MG 24 hr tablet TAKE 1 TABLET(5 MG) BY MOUTH AT BEDTIME  . Probiotic Product (PROBIOTIC PO) Take by mouth.  . risperiDONE (RISPERDAL) 1 MG tablet TAKE 1 TABLET AT BEDTIME  . zolmitriptan (ZOMIG-ZMT) 5 MG disintegrating tablet Take 1 tablet (5 mg total) by mouth daily as needed.    PHQ 2/9 Scores 03/11/2020 03/01/2019 01/24/2019 02/24/2018  PHQ - 2 Score 0 0 0 0  PHQ- 9 Score 0 0 - -    GAD 7 : Generalized Anxiety Score 03/11/2020  Nervous, Anxious, on Edge 0  Control/stop worrying 0  Worry too much - different things 0  Trouble relaxing 0  Restless 0  Easily annoyed or irritable 0  Afraid - awful might happen 0  Total GAD 7 Score 0    BP Readings from Last 3 Encounters:  03/11/20 104/62  03/01/19 128/78  01/24/19 124/70    Physical Exam Vitals and nursing note reviewed.  Constitutional:      General: She is not irritable.She is not in acute distress.    Appearance: She is well-developed.  HENT:     Head: Normocephalic and atraumatic.     Right Ear: Tympanic membrane and ear canal normal.     Left Ear: Tympanic membrane and ear canal normal.     Nose:     Right Sinus: No maxillary sinus tenderness.     Left Sinus: No maxillary sinus tenderness.  Eyes:     General: No scleral icterus.       Right eye: No discharge.        Left eye: No discharge.     Conjunctiva/sclera: Conjunctivae normal.  Neck:     Thyroid: No thyromegaly.     Vascular: No carotid bruit.  Cardiovascular:      Rate and Rhythm: Normal rate and regular rhythm.     Pulses: Normal pulses.     Heart sounds: Normal heart sounds.  Pulmonary:     Effort: Pulmonary effort is normal. No respiratory distress.     Breath sounds: No wheezing.  Chest:  Breasts:     Right: No mass, nipple discharge, skin change or tenderness.     Left: No mass, nipple discharge, skin change or tenderness.    Abdominal:     General: Bowel sounds are normal.     Palpations: Abdomen is soft.     Tenderness: There is no abdominal tenderness.  Musculoskeletal:     Cervical back: Normal range of motion. No erythema.     Right lower leg: No edema.     Left lower leg: No edema.  Lymphadenopathy:     Cervical: No  cervical adenopathy.  Skin:    General: Skin is warm and dry.     Findings: No rash.  Neurological:     Mental Status: She is alert and oriented to person, place, and time.     Cranial Nerves: No cranial nerve deficit.     Sensory: No sensory deficit.     Deep Tendon Reflexes: Reflexes are normal and symmetric.  Psychiatric:        Attention and Perception: Attention normal.        Mood and Affect: Mood normal.     Wt Readings from Last 3 Encounters:  03/11/20 218 lb (98.9 kg)  03/01/19 229 lb (103.9 kg)  01/24/19 229 lb (103.9 kg)    BP 104/62   Pulse 84   Temp 97.6 F (36.4 C) (Oral)   Ht 5\' 9"  (1.753 m)   Wt 218 lb (98.9 kg)   SpO2 97%   BMI 32.19 kg/m   Assessment and Plan: 1. Annual physical exam Exam is normal except for weight. Encourage regular exercise and appropriate dietary changes for continued weight loss. - CBC with Differential/Platelet - Comprehensive metabolic panel - Lipid panel - TSH  2. Encounter for screening mammogram for breast cancer At [place]; Future  3. Migraine without aura and responsive to treatment Controlled - unchanged and responds well to Zomig. - zolmitriptan (ZOMIG-ZMT) 5 MG disintegrating tablet; Take 1 tablet (5 mg  total) by mouth daily as needed.  Dispense: 36 tablet; Refill: 3  4. OAB (overactive bladder) Symptoms are well controlled, minor dry mouth is the only side effect - oxybutynin (DITROPAN-XL) 5 MG 24 hr tablet; TAKE 1 TABLET(5 MG) BY MOUTH AT BEDTIME  Dispense: 90 tablet; Refill: 3  5. Chronic schizoaffective disorder (HCC) Not currently active on Depakote and Risperdal - will continue current medications - Valproic Acid level - divalproex (DEPAKOTE ER) 500 MG 24 hr tablet; Take 3 tablets (1,500 mg total) by mouth daily.  Dispense: 270 tablet; Refill: 3  6. Need for hepatitis C screening test - Hepatitis C antibody   Partially dictated using Editor, commissioning. Any errors are unintentional.  Halina Maidens, MD [facility]  03/11/2020

## 2020-03-12 LAB — LIPID PANEL
Chol/HDL Ratio: 4.5 ratio — ABNORMAL HIGH (ref 0.0–4.4)
Cholesterol, Total: 186 mg/dL (ref 100–199)
HDL: 41 mg/dL (ref >39)
LDL Chol Calc (NIH): 120 mg/dL — ABNORMAL HIGH (ref 0–99)
Triglycerides: 140 mg/dL (ref 0–149)
VLDL Cholesterol Cal: 25 mg/dL (ref 5–40)

## 2020-03-12 LAB — CBC WITH DIFFERENTIAL/PLATELET
Basophils Absolute: 0.1 10*3/uL (ref 0.0–0.2)
Basos: 1 %
EOS (ABSOLUTE): 0.1 10*3/uL (ref 0.0–0.4)
Eos: 1 %
Hematocrit: 42.7 % (ref 34.0–46.6)
Hemoglobin: 14.4 g/dL (ref 11.1–15.9)
Immature Grans (Abs): 0 10*3/uL (ref 0.0–0.1)
Immature Granulocytes: 0 %
Lymphocytes Absolute: 2.7 10*3/uL (ref 0.7–3.1)
Lymphs: 30 %
MCH: 31.6 pg (ref 26.6–33.0)
MCHC: 33.7 g/dL (ref 31.5–35.7)
MCV: 94 fL (ref 79–97)
Monocytes Absolute: 0.4 10*3/uL (ref 0.1–0.9)
Monocytes: 5 %
Neutrophils Absolute: 5.8 10*3/uL (ref 1.4–7.0)
Neutrophils: 63 %
Platelets: 363 10*3/uL (ref 150–450)
RBC: 4.55 x10E6/uL (ref 3.77–5.28)
RDW: 12.5 % (ref 11.7–15.4)
WBC: 9.1 10*3/uL (ref 3.4–10.8)

## 2020-03-12 LAB — HEPATITIS C ANTIBODY: Hep C Virus Ab: <0.1 s/co ratio (ref 0.0–0.9)

## 2020-03-12 LAB — COMPREHENSIVE METABOLIC PANEL
ALT: 15 IU/L (ref 0–32)
AST: 14 IU/L (ref 0–40)
Albumin/Globulin Ratio: 1.4 (ref 1.2–2.2)
Albumin: 4.2 g/dL (ref 3.8–4.8)
Alkaline Phosphatase: 94 IU/L (ref 44–121)
BUN/Creatinine Ratio: 10 (ref 9–23)
BUN: 9 mg/dL (ref 6–24)
Bilirubin Total: 0.3 mg/dL (ref 0.0–1.2)
CO2: 21 mmol/L (ref 20–29)
Calcium: 9.8 mg/dL (ref 8.7–10.2)
Chloride: 101 mmol/L (ref 96–106)
Creatinine, Ser: 0.91 mg/dL (ref 0.57–1.00)
GFR calc Af Amer: 86 mL/min/{1.73_m2} (ref >59)
GFR calc non Af Amer: 75 mL/min/{1.73_m2} (ref >59)
Globulin, Total: 3.1 g/dL (ref 1.5–4.5)
Glucose: 85 mg/dL (ref 65–99)
Potassium: 5 mmol/L (ref 3.5–5.2)
Sodium: 139 mmol/L (ref 134–144)
Total Protein: 7.3 g/dL (ref 6.0–8.5)

## 2020-03-12 LAB — TSH: TSH: 3.08 u[IU]/mL (ref 0.450–4.500)

## 2020-03-12 LAB — VALPROIC ACID LEVEL: Valproic Acid Lvl: 36 ug/mL — ABNORMAL LOW (ref 50–100)

## 2020-03-18 ENCOUNTER — Ambulatory Visit (INDEPENDENT_AMBULATORY_CARE_PROVIDER_SITE_OTHER): Payer: BC Managed Care – PPO | Admitting: Obstetrics and Gynecology

## 2020-03-18 ENCOUNTER — Other Ambulatory Visit: Payer: Self-pay

## 2020-03-18 ENCOUNTER — Encounter: Payer: Self-pay | Admitting: Obstetrics and Gynecology

## 2020-03-18 VITALS — BP 116/72 | Ht 69.0 in | Wt 220.6 lb

## 2020-03-18 DIAGNOSIS — R875 Abnormal microbiological findings in specimens from female genital organs: Secondary | ICD-10-CM

## 2020-03-18 DIAGNOSIS — N939 Abnormal uterine and vaginal bleeding, unspecified: Secondary | ICD-10-CM

## 2020-03-18 NOTE — Progress Notes (Signed)
Patient ID: Beth Lewis, female   DOB: Apr 01, 1970, 50 y.o.   MRN: [id number]  Reason for Consult: Gynecologic Exam   Referred by [name], MD  Subjective:     HPI:  Beth Lewis is a 50 y.o. female. She presents today with complaints if vaginal spotting. She is currently taking an OCP. She reports that she has begun to have spotting throught the month as well as her periods seem to last longer than normal. Normally her periods are 3 days, but she has been having bleeding for 3-5 days.   Gynecological History Menarche: 14 LMP: 2/18/ 2022 Describes periods as regular, monthly generaaly lasting 3 days Last pap smear:  2019 NIL Last Mammogram: 2021 BIRADS 1 History of STDs: no Sexually Active: yes She identifies as a female and is sexually active with men.   Obstetrical History OB History  Gravida Para Term Preterm AB Living  2 2 2     2   SAB IAB Ectopic Multiple Live Births          2    # Outcome Date GA Lbr Len/2nd Weight Sex Delivery Anes PTL Lv  2 Term 08/19/05    F Vag-Spont   LIV  1 Term 04/18/01    F Vag-Spont   LIV     Past Medical History:  Diagnosis Date  . Basal cell carcinoma 2011,2014   forehead  . Cancer ([place])    skin ca  . Knee strain, right, initial encounter 02/20/2016  . Migraine    usually with her menses  . Schizoaffective disorder ([place])    Family History  Problem Relation Age of Onset  . Cancer Father        brain  . Breast cancer Cousin 25  . Colon cancer Maternal Grandmother 73  . Ovarian cancer Maternal Grandmother 97       mets to ovaries, not primary site   Past Surgical History:  Procedure Laterality Date  . BASAL CELL CARCINOMA EXCISION    . MOHS SURGERY  2012    Short Social History:  Social History   Tobacco Use  . Smoking status: Never Smoker  . Smokeless tobacco: Never Used  Substance Use Topics  . Alcohol use: Yes    Alcohol/week: 0.0 standard drinks    Comment: occasional    No Known  Allergies  Current Outpatient Medications  Medication Sig Dispense Refill  . divalproex (DEPAKOTE ER) 500 MG 24 hr tablet Take 3 tablets (1,500 mg total) by mouth daily. 270 tablet 3  . FIBER PO Take by mouth.    . LOW-OGESTREL 0.3-30 MG-MCG tablet TAKE 1 TABLET DAILY 84 tablet 3  . oxybutynin (DITROPAN-XL) 5 MG 24 hr tablet TAKE 1 TABLET(5 MG) BY MOUTH AT BEDTIME 90 tablet 3  . Probiotic Product (PROBIOTIC PO) Take by mouth.    . risperiDONE (RISPERDAL) 1 MG tablet TAKE 1 TABLET AT BEDTIME 90 tablet 3  . zolmitriptan (ZOMIG-ZMT) 5 MG disintegrating tablet Take 1 tablet (5 mg total) by mouth daily as needed. 36 tablet 3   No current facility-administered medications for this visit.    Review of Systems  Constitutional: Negative for chills, fatigue, fever and unexpected weight change.  HENT: Negative for trouble swallowing.  Eyes: Negative for loss of vision.  Respiratory: Negative for cough, shortness of breath and wheezing.  Cardiovascular: Negative for chest pain, leg swelling, palpitations and syncope.  GI: Negative for abdominal pain, blood in stool, diarrhea, nausea and vomiting.  GU: Negative for difficulty urinating, dysuria, frequency and hematuria.  Musculoskeletal: Negative for back pain, leg pain and joint pain.  Skin: Negative for rash.  Neurological: Negative for dizziness, headaches, light-headedness, numbness and seizures.  Psychiatric: Negative for behavioral problem, confusion, depressed mood and sleep disturbance.        Objective:  Objective   Vitals:   03/18/20 1452  BP: 116/72  Weight: 220 lb 9.6 oz (100.1 kg)  Height: 5\' 9"  (1.753 m)   Body mass index is 32.58 kg/m.  Physical Exam Vitals and nursing note reviewed.  Constitutional:      Appearance: She is well-developed and well-nourished.  HENT:     Head: Normocephalic and atraumatic.  Eyes:     Extraocular Movements: EOM normal.     Pupils: Pupils are equal, round, and reactive to light.   Cardiovascular:     Rate and Rhythm: Normal rate and regular rhythm.  Pulmonary:     Effort: Pulmonary effort is normal. No respiratory distress.  Genitourinary:    Comments: External: Normal appearing vulva. No lesions noted.  Speculum examination: Normal appearing cervix. Stenotic cervix. Cervix bleeds when touched with q-tip  No blood in the vaginal vault. Scant discharge.  Bimanual examination: Uterus midline, non-tender, normal in size, shape and contour.  No CMT. No adnexal masses. No adnexal tenderness. Pelvis not fixed. Skin:    General: Skin is warm and dry.  Neurological:     Mental Status: She is alert and oriented to person, place, and time.  Psychiatric:        Mood and Affect: Mood and affect normal.        Behavior: Behavior normal.        Thought Content: Thought content normal.        Judgment: Judgment normal.     Assessment/Plan:     50 yo with abnormal vaginal spotting and bleeding.  Urine pregnancy test is negative Will check nuswab for infection.  Patient will follow up for pelvic US.   bMore than 20 minutes were spent face to face with the patient in the room, reviewing the medical record, labs and images, and coordinating care for the patient. The plan of management was discussed in detail and counseling was provided.     Adrian Prows MD [facility], [facility] 03/18/2020 11:42 PM

## 2020-03-18 NOTE — Patient Instructions (Signed)
Abnormal Uterine Bleeding  Abnormal uterine bleeding is unusual bleeding from the uterus. It includes bleeding after sex, or bleeding or spotting between menstrual periods. It may also include bleeding that is heavier than normal, menstrual periods that last longer than usual, or bleeding that occurs after menopause. Abnormal uterine bleeding can affect teenagers, women in their reproductive years, pregnant women, and women who have reached menopause. Common causes of abnormal uterine bleeding include:  Pregnancy.  Growths of tissue (polyps).  Benign tumors or growths in the uterus (fibroids). These are not cancer.  Infection.  Cancer.  Too much or too little of some hormones in the body (hormonal imbalances). Any type of abnormal bleeding should be checked by a health care provider. Many cases are minor and simple to treat, but others may be more serious. Treatment will depend on the cause and severity of the bleeding. Follow these instructions at home: Medicines  Take over-the-counter and prescription medicines only as told by your health care provider.  Tell your health care provider about other medicines that you take. You may be asked to stop taking aspirin or medicines that contain aspirin. These medicines can make bleeding worse.  If you were prescribed iron pills, take them as told by your health care provider. Iron pills help to replace iron that your body loses because of this condition. Managing constipation In cases of severe bleeding, you may be asked to increase your iron intake to treat anemia. This may cause constipation. To prevent or treat constipation, you may need to:  Drink enough fluid to keep your urine pale yellow.  Take over-the-counter or prescription medicines.  Eat foods that are high in fiber, such as beans, whole grains, and fresh fruits and vegetables.  Limit foods that are high in fat and processed sugars, such as fried or sweet foods. General  instructions  Monitor your condition for any changes.  Do not use tampons, douche, or have sex until your health care provider says these things are okay.  Change your pads often.  Get regular exams. This includes pelvic exams and cervical cancer screenings. ? It is up to you to get the results of any tests that are done. Ask your health care provider, or the department that is doing the tests, when your results will be ready.  Keep all follow-up visits as told by your health care provider. This is important. Contact a health care provider if you:  Have bleeding that lasts for more than 1 week.  Feel dizzy at times.  Feel nauseous or you vomit.  Feel light-headed or weak.  Notice any other changes that show that your condition is getting worse. Get help right away if you:  Pass out.  Have bleeding that soaks through a pad every hour.  Have pain in the abdomen.  Have a fever or chills.  Become sweaty or weak.  Pass large blood clots from your vagina. Summary  Abnormal uterine bleeding is unusual bleeding from the uterus.  Any type of abnormal bleeding should be evaluated by a health care provider. Many cases are minor and simple to treat, but others may be more serious.  Treatment will depend on the cause of the bleeding.  Get help right away if you pass out, you have bleeding that soaks through a pad every hour, or you pass large blood clots from your vagina. This information is not intended to replace advice given to you by your health care provider. Make sure you discuss any questions you  have with your health care provider. Document Revised: 09/13/2019 Document Reviewed: 11/08/2018 Elsevier Patient Education  [place].

## 2020-03-20 LAB — NUSWAB VAGINITIS PLUS (VG+)
Candida albicans, NAA: NEGATIVE
Candida glabrata, NAA: NEGATIVE
Chlamydia trachomatis, NAA: NEGATIVE
Neisseria gonorrhoeae, NAA: NEGATIVE
Trich vag by NAA: NEGATIVE

## 2020-03-27 ENCOUNTER — Encounter: Payer: Self-pay | Admitting: Obstetrics and Gynecology

## 2020-03-27 ENCOUNTER — Ambulatory Visit (INDEPENDENT_AMBULATORY_CARE_PROVIDER_SITE_OTHER): Payer: BC Managed Care – PPO | Admitting: Obstetrics and Gynecology

## 2020-03-27 ENCOUNTER — Other Ambulatory Visit: Payer: Self-pay | Admitting: Obstetrics and Gynecology

## 2020-03-27 ENCOUNTER — Other Ambulatory Visit: Payer: Self-pay

## 2020-03-27 ENCOUNTER — Ambulatory Visit (INDEPENDENT_AMBULATORY_CARE_PROVIDER_SITE_OTHER): Payer: BC Managed Care – PPO

## 2020-03-27 VITALS — BP 122/70 | Ht 69.0 in | Wt 219.2 lb

## 2020-03-27 DIAGNOSIS — N939 Abnormal uterine and vaginal bleeding, unspecified: Secondary | ICD-10-CM

## 2020-03-27 DIAGNOSIS — D251 Intramural leiomyoma of uterus: Secondary | ICD-10-CM | POA: Diagnosis not present

## 2020-03-27 NOTE — Progress Notes (Signed)
Patient ID: Beth Lewis, female   DOB: 15-Jun-1970, 50 y.o.   MRN: [id number]  Reason for Consult: Gynecologic Exam   Referred by [name], MD  Subjective:     HPI:  Beth Lewis is a 50 y.o. female . She is following up regarding irregular bleeding and spotting.     Past Medical History:  Diagnosis Date  . Basal cell carcinoma 2011,2014   forehead  . Cancer ([place])    skin ca  . Knee strain, right, initial encounter 02/20/2016  . Migraine    usually with her menses  . Schizoaffective disorder ([place])    Family History  Problem Relation Age of Onset  . Cancer Father        brain  . Breast cancer Cousin 25  . Colon cancer Maternal Grandmother 23  . Ovarian cancer Maternal Grandmother 97       mets to ovaries, not primary site   Past Surgical History:  Procedure Laterality Date  . BASAL CELL CARCINOMA EXCISION    . MOHS SURGERY  2012    Short Social History:  Social History   Tobacco Use  . Smoking status: Never Smoker  . Smokeless tobacco: Never Used  Substance Use Topics  . Alcohol use: Yes    Alcohol/week: 0.0 standard drinks    Comment: occasional    No Known Allergies  Current Outpatient Medications  Medication Sig Dispense Refill  . divalproex (DEPAKOTE ER) 500 MG 24 hr tablet Take 3 tablets (1,500 mg total) by mouth daily. 270 tablet 3  . FIBER PO Take by mouth.    . LOW-OGESTREL 0.3-30 MG-MCG tablet TAKE 1 TABLET DAILY 84 tablet 3  . oxybutynin (DITROPAN-XL) 5 MG 24 hr tablet TAKE 1 TABLET(5 MG) BY MOUTH AT BEDTIME 90 tablet 3  . Probiotic Product (PROBIOTIC PO) Take by mouth.    . risperiDONE (RISPERDAL) 1 MG tablet TAKE 1 TABLET AT BEDTIME 90 tablet 3  . zolmitriptan (ZOMIG-ZMT) 5 MG disintegrating tablet Take 1 tablet (5 mg total) by mouth daily as needed. 36 tablet 3   No current facility-administered medications for this visit.    Review of Systems  Constitutional: Negative for chills, fatigue, fever and unexpected weight  change.  HENT: Negative for trouble swallowing.  Eyes: Negative for loss of vision.  Respiratory: Negative for cough, shortness of breath and wheezing.  Cardiovascular: Negative for chest pain, leg swelling, palpitations and syncope.  GI: Negative for abdominal pain, blood in stool, diarrhea, nausea and vomiting.  GU: Negative for difficulty urinating, dysuria, frequency and hematuria.  Musculoskeletal: Negative for back pain, leg pain and joint pain.  Skin: Negative for rash.  Neurological: Negative for dizziness, headaches, light-headedness, numbness and seizures.  Psychiatric: Negative for behavioral problem, confusion, depressed mood and sleep disturbance.        Objective:  Objective   Vitals:   03/27/20 1453  BP: 122/70  Weight: 219 lb 3.2 oz (99.4 kg)  Height: 5\' 9"  (1.753 m)   Body mass index is 32.37 kg/m.  Physical Exam Vitals and nursing note reviewed. Exam conducted with a chaperone present.  Constitutional:      Appearance: Normal appearance. She is well-developed.  HENT:     Head: Normocephalic and atraumatic.  Eyes:     Extraocular Movements: Extraocular movements intact.     Pupils: Pupils are equal, round, and reactive to light.  Cardiovascular:     Rate and Rhythm: Normal rate and regular rhythm.  Pulmonary:  Effort: Pulmonary effort is normal. No respiratory distress.     Breath sounds: Normal breath sounds.  Abdominal:     General: Abdomen is flat.     Palpations: Abdomen is soft.  Musculoskeletal:        General: No signs of injury.     Cervical back: Normal range of motion.  Skin:    General: Skin is warm and dry.  Neurological:     General: No focal deficit present.     Mental Status: She is alert and oriented to person, place, and time.  Psychiatric:        Behavior: Behavior normal.        Thought Content: Thought content normal.        Judgment: Judgment normal.     Assessment/Plan:     50 y.o. [id number] with complaints of  abnormal uterine bleeding secondary to uterine fibroids.   We discussed options for management of uterine fibroids.  We discussed that this can include hormonal medications such as an oral contraceptive pill, progesterone only pill, Depo-Provera, Nexplanon,  progesterone IUD.  We discussed surgical options including hysteroscopy with myomectomy for small submucosal fibroids, abdominal or laparoscopic myomectomy, uterine fibroid embolization, and hysterectomy.  We discussed the pros and cons of these different procedures and medical management options in detail.   We reviewed the options in detail and elected that no management would best suit her needs.  More than 20 minutes were spent face to face with the patient in the room, reviewing the medical record, labs and images, and coordinating care for the patient. The plan of management was discussed in detail and counseling was provided.    Adrian Prows MD [facility], [facility] 03/27/2020 3:34 PM

## 2020-03-27 NOTE — Patient Instructions (Signed)
Uterine Fibroids  Uterine fibroids, also called leiomyomas, are noncancerous (benign) tumors that can grow in the uterus. They can cause heavy menstrual bleeding and pain. Fibroids may also grow in the fallopian tubes, cervix, or tissues (ligaments) near the uterus. You may have one or many fibroids. Fibroids vary in size, weight, and where they grow in the uterus. Some can become quite large. Most fibroids do not require medical treatment. What are the causes? The cause of this condition is not known. What increases the risk? You are more likely to develop this condition if you:  Are in your 30s or 40s and have not gone through menopause.  Have a family history of this condition.  Are of African American descent.  Started your menstrual period at age 102 or younger.  Have never given birth.  Are overweight or obese. What are the signs or symptoms? Many women do not have any symptoms. Symptoms of this condition may include:  Heavy menstrual bleeding.  Bleeding between menstrual periods.  Pain and pressure in the pelvic area, between your hip bones.  Pain during sex.  Bladder problems, such as needing to urinate right away or more often than usual.  Inability to have children (infertility).  Failure to carry pregnancy to term (miscarriage). How is this diagnosed? This condition may be diagnosed based on:  Your symptoms and medical history.  A physical exam.  A pelvic exam that includes feeling for any tumors.  Imaging tests, such as ultrasound or MRI. How is this treated? Treatment for this condition may include follow-up visits with your health care provider to monitor your fibroids for any changes. Other treatment may include:  Medicines, such as: ? Medicines to relieve pain, including aspirin and NSAIDs, such as ibuprofen or naproxen. ? Hormone therapy. Treatment may be given as a pill or an injection, or it may be inserted into the uterus using an intrauterine  device (IUD).  Surgery that would do one of the following: ? Remove the fibroids (myomectomy). This may be recommended if fibroids affect your fertility and you want to become pregnant. ? Remove the uterus (hysterectomy). ? Block the blood supply to the fibroids (uterine artery embolization). This can cause them to shrink and die. Follow these instructions at home: Medicines  Take over-the-counter and prescription medicines only as told by your health care provider.  Ask your health care provider if you should take iron pills or eat more iron-rich foods, such as dark green, leafy vegetables. Heavy menstrual bleeding can cause low iron levels. Managing pain If directed, apply heat to your back or abdomen to reduce pain. Use the heat source that your health care provider recommends, such as a moist heat pack or a heating pad. To apply heat:  Place a towel between your skin and the heat source.  Leave the heat on for 20-30 minutes.  Remove the heat if your skin turns bright red. This is especially important if you are unable to feel pain, heat, or cold. You may have a greater risk of getting burned.   General instructions  Pay close attention to your menstrual cycle. Tell your health care provider about any changes, such as: ? Heavier bleeding that requires you to change your pads or tampons more than usual. ? A change in the number of days that your menstrual period lasts. ? A change in symptoms that come with your menstrual period, such as back pain or cramps in your abdomen.  Keep all follow-up visits. This is  important, especially if your fibroids need to be monitored for any changes. Contact a health care provider if you:  Have pelvic pain, back pain, or cramps in your abdomen that do not get better with medicine or heat.  Develop new bleeding between menstrual periods.  Have increased bleeding during or between menstrual periods.  Feel more tired or weak than usual.  Feel  light-headed. Get help right away if you:  Faint.  Have pelvic pain that suddenly gets worse.  Have severe vaginal bleeding that soaks a tampon or pad in 30 minutes or less. Summary  Uterine fibroids are noncancerous (benign) tumors that can develop in the uterus.  The exact cause of this condition is not known.  Most fibroids do not require medical treatment unless they affect your ability to have children (fertility).  Contact a health care provider if you have pelvic pain, back pain, or cramps in your abdomen that do not get better with medicines.  Get help right away if you faint, have pelvic pain that suddenly gets worse, or have severe vaginal bleeding. This information is not intended to replace advice given to you by your health care provider. Make sure you discuss any questions you have with your health care provider. Document Revised: 08/08/2019 Document Reviewed: 08/08/2019 Elsevier Patient Education  [place].

## 2020-06-17 ENCOUNTER — Ambulatory Visit
Admission: EM | Admit: 2020-06-17 | Discharge: 2020-06-17 | Disposition: A | Discharge status: Home / Self Care | Payer: BC Managed Care – PPO | Attending: Emergency Medicine | Admitting: Emergency Medicine

## 2020-06-17 ENCOUNTER — Other Ambulatory Visit: Payer: Self-pay

## 2020-06-17 DIAGNOSIS — L247 Irritant contact dermatitis due to plants, except food: Secondary | ICD-10-CM

## 2020-06-17 MED ORDER — PREDNISONE 10 MG (21) PO TBPK: ORAL_TABLET | ORAL | 0 refills | Status: DC

## 2020-06-17 NOTE — ED Triage Notes (Signed)
Pt c/o rash that's on her face and neck that started Friday. Pt states it itches

## 2020-06-17 NOTE — Discharge Instructions (Addendum)
Take the prednisone as directed by the package insert.  Continue to apply calamine lotion to your rash to help dry it up and prevent the spread.  The 2 weeping lesions on your nose and cheek keep clean and dry.  Dab away any buildup of drainage with a clean tissue and wash your hands immediately afterwards.  If you develop any swelling of your eye, itching in your eye, pain in your eye, or changes to your vision in your right eye you need to go to the ER for evaluation.

## 2020-06-17 NOTE — ED Provider Notes (Signed)
[facility]    CSN: [id number] Arrival date & time: 06/17/20  0817      History   Chief Complaint Chief Complaint  Patient presents with  . Rash    HPI Beth Lewis is a 50 y.o. female.   HPI   50 year old female here for evaluation of rash on face and neck.  Patient reports that 3 days ago she got into a patch of poison oak in her yard and she developed an itchy red rash.  The rash has since spread to her face and temple on the right side.  She has a weeping lesion on her right cheek and on the right side of her nose.  Past Medical History:  Diagnosis Date  . Basal cell carcinoma 2011,2014   forehead  . Cancer ([place])    skin ca  . Knee strain, right, initial encounter 02/20/2016  . Migraine    usually with her menses  . Schizoaffective disorder [facility])     Patient Active Problem List   Diagnosis Date Noted  . OAB (overactive bladder) 01/05/2018  . Urge incontinence of urine 01/05/2018  . Dyslipidemia 07/16/2014  . Migraine without aura and responsive to treatment 07/16/2014  . Chronic schizoaffective disorder ([place]) 07/16/2014    Past Surgical History:  Procedure Laterality Date  . BASAL CELL CARCINOMA EXCISION    . MOHS SURGERY  2012    OB History    Gravida  2   Para  2   Term  2   Preterm      AB      Living  2     SAB      IAB      Ectopic      Multiple      Live Births  2            Home Medications    Prior to Admission medications   Medication Sig Start Date End Date Taking? Authorizing Provider  divalproex (DEPAKOTE ER) 500 MG 24 hr tablet Take 3 tablets (1,500 mg total) by mouth daily. 03/11/20  Yes [name], MD  FIBER PO Take by mouth.   Yes [provider]  LOW-OGESTREL 0.3-30 MG-MCG tablet TAKE 1 TABLET DAILY 02/07/20  Yes [name], MD  oxybutynin (DITROPAN-XL) 5 MG 24 hr tablet TAKE 1 TABLET(5 MG) BY MOUTH AT BEDTIME 03/11/20  Yes [name], MD  predniSONE (STERAPRED  UNI-PAK 21 TAB) 10 MG (21) TBPK tablet Take 6 tablets on day 1, 5 tablets day 2, 4 tablets day 3, 3 tablets day 4, 2 tablets day 5, 1 tablet day 6 06/17/20  Yes Margarette Canada, NP  Probiotic Product (PROBIOTIC PO) Take by mouth.   Yes [provider]  risperiDONE (RISPERDAL) 1 MG tablet TAKE 1 TABLET AT BEDTIME 11/27/19  Yes [name], MD  zolmitriptan (ZOMIG-ZMT) 5 MG disintegrating tablet Take 1 tablet (5 mg total) by mouth daily as needed. 03/11/20  Yes [name], MD    Family History Family History  Problem Relation Age of Onset  . Cancer Father        brain  . Breast cancer Cousin 25  . Colon cancer Maternal Grandmother 19  . Ovarian cancer Maternal Grandmother 97       mets to ovaries, not primary site    Social History Social History   Tobacco Use  . Smoking status: Never Smoker  . Smokeless tobacco: Never Used  Vaping Use  .  Vaping Use: Never used  Substance Use Topics  . Alcohol use: Yes    Alcohol/week: 0.0 standard drinks    Comment: occasional  . Drug use: No     Allergies   Patient has no known allergies.   Review of Systems Review of Systems  Constitutional: Negative for activity change and appetite change.  HENT: Negative for facial swelling and trouble swallowing.   Eyes: Negative for pain, discharge, itching and visual disturbance.  Respiratory: Negative for shortness of breath.   Skin: Positive for rash.     Physical Exam Triage Vital Signs ED Triage Vitals [06/17/20 0828]  Enc Vitals Group     BP 119/76     Pulse Rate (!) 101     Resp 16     Temp 98 F (36.7 C)     Temp Source Oral     SpO2 100 %     Weight 215 lb (97.5 kg)     Height      Head Circumference      Peak Flow      Pain Score 0     Pain Loc      Pain Edu?      Excl. in [place]?    No data found.  Updated Vital Signs BP 119/76 (BP Location: Left Arm)   Pulse (!) 101   Temp 98 F (36.7 C) (Oral)   Resp 16   Wt 215 lb (97.5 kg)   SpO2 100%   BMI  31.75 kg/m   Visual Acuity Right Eye Distance:   Left Eye Distance:   Bilateral Distance:    Right Eye Near:   Left Eye Near:    Bilateral Near:     Physical Exam Vitals and nursing note reviewed.  Constitutional:      General: She is not in acute distress.    Appearance: Normal appearance. She is not ill-appearing.  HENT:     Head: Normocephalic and atraumatic.  Cardiovascular:     Rate and Rhythm: Normal rate and regular rhythm.     Pulses: Normal pulses.     Heart sounds: Normal heart sounds. No murmur heard. No gallop.   Pulmonary:     Effort: Pulmonary effort is normal.     Breath sounds: Normal breath sounds. No stridor. No wheezing, rhonchi or rales.  Skin:    General: Skin is warm.     Capillary Refill: Capillary refill takes less than 2 seconds.     Findings: Rash present.  Neurological:     General: No focal deficit present.     Mental Status: She is alert and oriented to person, place, and time.  Psychiatric:        Mood and Affect: Mood normal.        Behavior: Behavior normal.        Thought Content: Thought content normal.        Judgment: Judgment normal.      UC Treatments / Results  Labs (all labs ordered are listed, but only abnormal results are displayed) Labs Reviewed - No data to display  EKG   Radiology No results found.  Procedures Procedures (including critical care time)  Medications Ordered in UC Medications - No data to display  Initial Impression / Assessment and Plan / UC Course  I have reviewed the triage vital signs and the nursing notes.  Pertinent labs & imaging results that were available during my care of the patient were reviewed by me and considered  in my medical decision making (see chart for details).   Patient is a very pleasant 50 year old female here for evaluation of rash on the right side of her neck and right face that started 3 days ago after she got into a patch of poison oak in her yard.  She states that  she put some steroid cream on the rash initially to help with itching and feels like the steroid cream actually inflamed the rash so she stopped.  She is here today because the 2 areas on her face, 1 on her right cheek and the other on the right side of her nose near the nasolabial fold are weeping a yellow fluid.  She denies any changes to her vision, itchiness in her eyes, or pain with eye movement.  Patient also denies any swelling of her lips or tongue, tightness in her throat, or trouble breathing.  Patient does have a raised red rash that is scaly on the right neck, right temple, right angle of the jaw, and 2 weeping lesions described above.  We will treat patient with a prednisone Dosepak, have her continue calamine lotion to dry up the rash.  Patient vies that if she develops any eye involvement she is to go to the ER for evaluation.   Final Clinical Impressions(s) / UC Diagnoses   Final diagnoses:  Irritant contact dermatitis due to plants, except food     Discharge Instructions     Take the prednisone as directed by the package insert.  Continue to apply calamine lotion to your rash to help dry it up and prevent the spread.  The 2 weeping lesions on your nose and cheek keep clean and dry.  Dab away any buildup of drainage with a clean tissue and wash your hands immediately afterwards.  If you develop any swelling of your eye, itching in your eye, pain in your eye, or changes to your vision in your right eye you need to go to the ER for evaluation.    ED Prescriptions    Medication Sig Dispense Auth. Provider   predniSONE (STERAPRED UNI-PAK 21 TAB) 10 MG (21) TBPK tablet Take 6 tablets on day 1, 5 tablets day 2, 4 tablets day 3, 3 tablets day 4, 2 tablets day 5, 1 tablet day 6 21 tablet Margarette Canada, NP     PDMP not reviewed this encounter.   Margarette Canada, NP 06/17/20 [phone]

## 2020-06-25 ENCOUNTER — Encounter: Payer: Self-pay | Admitting: Internal Medicine

## 2020-06-25 ENCOUNTER — Telehealth (INDEPENDENT_AMBULATORY_CARE_PROVIDER_SITE_OTHER): Payer: BC Managed Care – PPO | Admitting: Internal Medicine

## 2020-06-25 ENCOUNTER — Other Ambulatory Visit: Payer: Self-pay

## 2020-06-25 VITALS — Ht 69.0 in

## 2020-06-25 DIAGNOSIS — L247 Irritant contact dermatitis due to plants, except food: Secondary | ICD-10-CM | POA: Diagnosis not present

## 2020-06-25 MED ORDER — PREDNISONE 10 MG PO TABS: 10.0000 mg | ORAL_TABLET | ORAL | 0 refills | Status: AC

## 2020-06-25 NOTE — Progress Notes (Signed)
Date:  06/25/2020   Name:  Beth Lewis   DOB:  Feb 15, 1970   MRN:  [id number]  This encounter was conducted via video encounter due to the need for social distancing in light of the Covid-19 pandemic.  The patient was correctly identified.  I advised that I am conducting the visit from a secure room in my office at [facility] clinic.  The patient is located home. The limitations of this form of encounter were discussed with the patient and he/she agreed to proceed.  Some vital signs will be absent.  Chief Complaint: Rash (05/31- she was given prednisone pack from UC. It was on her face and neck- that's gone. Now on her stomach. Itching a lot- worse at night. Husband was in yard and they have poison oak in the yard. He brought in the house and she was exposed. )  Rash This is a recurrent problem. The affected locations include the face, neck and abdomen (face and neck improved; now on abdomen and buttocks). The rash is characterized by blistering, redness and itchiness. She was exposed to plant contact. Pertinent negatives include no fatigue. Past treatments include oral steroids (face and neck improved with taper of prednisone but now has rash on abdomen).    Lab Results  Component Value Date   CREATININE 0.91 03/11/2020   BUN 9 03/11/2020   NA 139 03/11/2020   K 5.0 03/11/2020   CL 101 03/11/2020   CO2 21 03/11/2020   Lab Results  Component Value Date   CHOL 186 03/11/2020   HDL 41 03/11/2020   LDLCALC 120 (H) 03/11/2020   TRIG 140 03/11/2020   CHOLHDL 4.5 (H) 03/11/2020   Lab Results  Component Value Date   TSH 3.080 03/11/2020   Lab Results  Component Value Date   HGBA1C 5.2 02/23/2017   Lab Results  Component Value Date   WBC 9.1 03/11/2020   HGB 14.4 03/11/2020   HCT 42.7 03/11/2020   MCV 94 03/11/2020   PLT 363 03/11/2020   Lab Results  Component Value Date   ALT 15 03/11/2020   AST 14 03/11/2020   ALKPHOS 94 03/11/2020   BILITOT 0.3 03/11/2020      Review of Systems  Constitutional: Negative for chills and fatigue.  Eyes: Negative for itching and visual disturbance.  Skin: Positive for rash.    Patient Active Problem List   Diagnosis Date Noted  . OAB (overactive bladder) 01/05/2018  . Urge incontinence of urine 01/05/2018  . Dyslipidemia 07/16/2014  . Migraine without aura and responsive to treatment 07/16/2014  . Chronic schizoaffective disorder ([place]) 07/16/2014    No Known Allergies  Past Surgical History:  Procedure Laterality Date  . BASAL CELL CARCINOMA EXCISION    . MOHS SURGERY  2012    Social History   Tobacco Use  . Smoking status: Never Smoker  . Smokeless tobacco: Never Used  Vaping Use  . Vaping Use: Never used  Substance Use Topics  . Alcohol use: Yes    Alcohol/week: 0.0 standard drinks    Comment: occasional  . Drug use: No     Medication list has been reviewed and updated.  Current Meds  Medication Sig  . divalproex (DEPAKOTE ER) 500 MG 24 hr tablet Take 3 tablets (1,500 mg total) by mouth daily.  Marland Kitchen FIBER PO Take by mouth.  . LOW-OGESTREL 0.3-30 MG-MCG tablet TAKE 1 TABLET DAILY  . oxybutynin (DITROPAN-XL) 5 MG 24 hr tablet TAKE 1 TABLET(5  MG) BY MOUTH AT BEDTIME  . Probiotic Product (PROBIOTIC PO) Take by mouth.  . risperiDONE (RISPERDAL) 1 MG tablet TAKE 1 TABLET AT BEDTIME  . zolmitriptan (ZOMIG-ZMT) 5 MG disintegrating tablet Take 1 tablet (5 mg total) by mouth daily as needed.  . [DISCONTINUED] predniSONE (STERAPRED UNI-PAK 21 TAB) 10 MG (21) TBPK tablet Take 6 tablets on day 1, 5 tablets day 2, 4 tablets day 3, 3 tablets day 4, 2 tablets day 5, 1 tablet day 6    PHQ 2/9 Scores 06/25/2020 03/11/2020 03/01/2019 01/24/2019  PHQ - 2 Score 0 0 0 0  PHQ- 9 Score 0 0 0 -    GAD 7 : Generalized Anxiety Score 06/25/2020 03/11/2020  Nervous, Anxious, on Edge 0 0  Control/stop worrying 0 0  Worry too much - different things 0 0  Trouble relaxing 0 0  Restless 0 0  Easily annoyed or  irritable 0 0  Afraid - awful might happen 0 0  Total GAD 7 Score 0 0  Anxiety Difficulty Not difficult at all -    BP Readings from Last 3 Encounters:  06/17/20 119/76  03/27/20 122/70  03/18/20 116/72    Physical Exam Constitutional:      General: She is not in acute distress.    Appearance: Normal appearance.  Skin:    Findings: Rash (linear vesicles on abdomen; rash on face now flat and resolving) present.  Neurological:     Mental Status: She is alert.     Wt Readings from Last 3 Encounters:  06/17/20 215 lb (97.5 kg)  03/27/20 219 lb 3.2 oz (99.4 kg)  03/18/20 220 lb 9.6 oz (100.1 kg)    Ht 5\' 9"  (1.753 m)   BMI 31.75 kg/m   Assessment and Plan: 1. Irritant contact dermatitis due to plants, except food Recommend washing all possible sources of persistent plant oils Use topical cortisone locally as needed - predniSONE (DELTASONE) 10 MG tablet; Take 1 tablet (10 mg total) by mouth as directed for 6 days. Take 6,5,4,3,2,1 then stop  Dispense: 21 tablet; Refill: 0  I spent 6 minutes on this encounter. Partially dictated using [employer], [place]. Any errors are unintentional.  Halina Maidens, MD [facility]  06/25/2020

## 2020-06-26 ENCOUNTER — Telehealth: Payer: BC Managed Care – PPO | Admitting: Internal Medicine

## 2020-08-28 ENCOUNTER — Other Ambulatory Visit: Payer: Self-pay | Admitting: Internal Medicine

## 2020-08-28 DIAGNOSIS — Z1231 Encounter for screening mammogram for malignant neoplasm of breast: Secondary | ICD-10-CM

## 2020-09-11 ENCOUNTER — Other Ambulatory Visit: Payer: Self-pay

## 2020-09-11 ENCOUNTER — Ambulatory Visit
Admission: RE | Admit: 2020-09-11 | Discharge: 2020-09-11 | Disposition: A | Discharge status: Home / Self Care | Payer: BC Managed Care – PPO | Source: Ambulatory Visit | Attending: Internal Medicine | Admitting: Internal Medicine

## 2020-09-11 DIAGNOSIS — Z1231 Encounter for screening mammogram for malignant neoplasm of breast: Secondary | ICD-10-CM | POA: Diagnosis not present

## 2020-09-16 ENCOUNTER — Other Ambulatory Visit: Payer: Self-pay | Admitting: Internal Medicine

## 2020-09-16 DIAGNOSIS — R928 Other abnormal and inconclusive findings on diagnostic imaging of breast: Secondary | ICD-10-CM

## 2020-09-16 DIAGNOSIS — N6310 Unspecified lump in the right breast, unspecified quadrant: Secondary | ICD-10-CM

## 2020-09-19 ENCOUNTER — Other Ambulatory Visit: Payer: Self-pay

## 2020-09-19 ENCOUNTER — Ambulatory Visit
Admission: RE | Admit: 2020-09-19 | Discharge: 2020-09-19 | Disposition: A | Discharge status: Home / Self Care | Payer: BC Managed Care – PPO | Source: Ambulatory Visit | Attending: Internal Medicine | Admitting: Internal Medicine

## 2020-09-19 DIAGNOSIS — N6310 Unspecified lump in the right breast, unspecified quadrant: Secondary | ICD-10-CM | POA: Diagnosis not present

## 2020-09-19 DIAGNOSIS — R922 Inconclusive mammogram: Secondary | ICD-10-CM | POA: Diagnosis not present

## 2020-09-19 DIAGNOSIS — R928 Other abnormal and inconclusive findings on diagnostic imaging of breast: Secondary | ICD-10-CM | POA: Insufficient documentation

## 2020-09-20 ENCOUNTER — Other Ambulatory Visit: Payer: Self-pay | Admitting: Internal Medicine

## 2020-09-20 DIAGNOSIS — R928 Other abnormal and inconclusive findings on diagnostic imaging of breast: Secondary | ICD-10-CM

## 2020-09-20 DIAGNOSIS — N6310 Unspecified lump in the right breast, unspecified quadrant: Secondary | ICD-10-CM

## 2020-09-30 ENCOUNTER — Ambulatory Visit
Admission: RE | Admit: 2020-09-30 | Discharge: 2020-09-30 | Disposition: A | Discharge status: Home / Self Care | Payer: BC Managed Care – PPO | Source: Ambulatory Visit | Attending: Internal Medicine | Admitting: Internal Medicine

## 2020-09-30 ENCOUNTER — Other Ambulatory Visit: Payer: Self-pay

## 2020-09-30 DIAGNOSIS — N6310 Unspecified lump in the right breast, unspecified quadrant: Secondary | ICD-10-CM | POA: Diagnosis not present

## 2020-09-30 DIAGNOSIS — R928 Other abnormal and inconclusive findings on diagnostic imaging of breast: Secondary | ICD-10-CM | POA: Diagnosis not present

## 2020-09-30 DIAGNOSIS — N6312 Unspecified lump in the right breast, upper inner quadrant: Secondary | ICD-10-CM | POA: Diagnosis not present

## 2020-09-30 DIAGNOSIS — C50211 Malignant neoplasm of upper-inner quadrant of right female breast: Secondary | ICD-10-CM | POA: Diagnosis not present

## 2020-09-30 HISTORY — PX: BREAST BIOPSY: SHX20

## 2020-10-01 ENCOUNTER — Other Ambulatory Visit: Payer: Self-pay | Admitting: Internal Medicine

## 2020-10-01 ENCOUNTER — Ambulatory Visit: Payer: Self-pay | Admitting: *Deleted

## 2020-10-01 DIAGNOSIS — Z170 Estrogen receptor positive status [ER+]: Secondary | ICD-10-CM | POA: Insufficient documentation

## 2020-10-01 DIAGNOSIS — C50211 Malignant neoplasm of upper-inner quadrant of right female breast: Secondary | ICD-10-CM | POA: Insufficient documentation

## 2020-10-01 DIAGNOSIS — C50911 Malignant neoplasm of unspecified site of right female breast: Secondary | ICD-10-CM | POA: Insufficient documentation

## 2020-10-01 NOTE — Telephone Encounter (Signed)
I spoke with patient regarding Surgical oncology referral.  She prefers [facility]. Dr. Kern Reap.  Referral placed. She reports doing well, no other needs at this time.

## 2020-10-01 NOTE — Telephone Encounter (Signed)
Please review.  KP

## 2020-10-01 NOTE — Telephone Encounter (Signed)
Called received from [place], RN from Breast center with Dr. Owens Shark regarding breast biopsy results, [phone]. Vaughan Basta , RN spoke with patient and gave diagnosis : invasive mammary carcinoma , grade 1. Per Vaughan Basta , RN patient is requesting a Psychologist, sport and exercise at [facility] for surgical referral. Patient would like to discuss with Dr. Army Melia, and have PCP contact her back.

## 2020-10-02 NOTE — Telephone Encounter (Signed)
Noted  KP 

## 2020-10-04 DIAGNOSIS — N6312 Unspecified lump in the right breast, upper inner quadrant: Secondary | ICD-10-CM | POA: Diagnosis not present

## 2020-10-07 DIAGNOSIS — C50911 Malignant neoplasm of unspecified site of right female breast: Secondary | ICD-10-CM | POA: Diagnosis not present

## 2020-10-08 ENCOUNTER — Other Ambulatory Visit: Payer: Self-pay | Admitting: Internal Medicine

## 2020-10-08 LAB — SURGICAL PATHOLOGY

## 2020-10-14 DIAGNOSIS — Z6832 Body mass index (BMI) 32.0-32.9, adult: Secondary | ICD-10-CM | POA: Diagnosis not present

## 2020-10-14 DIAGNOSIS — Z803 Family history of malignant neoplasm of breast: Secondary | ICD-10-CM | POA: Diagnosis not present

## 2020-10-14 DIAGNOSIS — C50911 Malignant neoplasm of unspecified site of right female breast: Secondary | ICD-10-CM | POA: Diagnosis not present

## 2020-10-14 DIAGNOSIS — C50211 Malignant neoplasm of upper-inner quadrant of right female breast: Secondary | ICD-10-CM | POA: Diagnosis not present

## 2020-10-16 DIAGNOSIS — C50211 Malignant neoplasm of upper-inner quadrant of right female breast: Secondary | ICD-10-CM | POA: Diagnosis not present

## 2020-10-16 DIAGNOSIS — C50911 Malignant neoplasm of unspecified site of right female breast: Secondary | ICD-10-CM | POA: Diagnosis not present

## 2020-10-19 HISTORY — PX: MASTECTOMY, PARTIAL: SHX709

## 2020-10-29 ENCOUNTER — Ambulatory Visit (INDEPENDENT_AMBULATORY_CARE_PROVIDER_SITE_OTHER): Payer: BC Managed Care – PPO | Admitting: Internal Medicine

## 2020-10-29 ENCOUNTER — Other Ambulatory Visit: Payer: Self-pay

## 2020-10-29 ENCOUNTER — Encounter: Payer: Self-pay | Admitting: Internal Medicine

## 2020-10-29 VITALS — BP 118/78 | HR 68 | Temp 97.8°F | Ht 69.0 in | Wt 216.0 lb

## 2020-10-29 DIAGNOSIS — N3000 Acute cystitis without hematuria: Secondary | ICD-10-CM

## 2020-10-29 DIAGNOSIS — C50911 Malignant neoplasm of unspecified site of right female breast: Secondary | ICD-10-CM | POA: Diagnosis not present

## 2020-10-29 LAB — POC URINALYSIS WITH MICROSCOPIC (NON AUTO)MANUAL RESULT
Bilirubin, UA: NEGATIVE
Crystals: 0
Glucose, UA: NEGATIVE
Ketones, UA: NEGATIVE
Mucus, UA: 0
Nitrite, UA: NEGATIVE
Protein, UA: NEGATIVE
RBC: 0 M/uL — AB (ref 4.04–5.48)
Spec Grav, UA: 1.025 (ref 1.010–1.025)
Urobilinogen, UA: 0.2 E.U./dL
WBC Casts, UA: 4
pH, UA: 5 (ref 5.0–8.0)

## 2020-10-29 MED ORDER — NITROFURANTOIN MONOHYD MACRO 100 MG PO CAPS
100.0000 mg | ORAL_CAPSULE | Freq: Two times a day (BID) | ORAL | 0 refills | Status: AC
Start: 2020-10-29 — End: 2020-11-05

## 2020-10-29 NOTE — Progress Notes (Signed)
Date:  10/29/2020   Name:  Beth Lewis   DOB:  January 23, 1970   MRN:  [id number]   Chief Complaint: Urinary Tract Infection  Urinary Tract Infection  This is a new problem. The current episode started in the past 7 days. The problem occurs intermittently. The problem has been waxing and waning. The quality of the pain is described as burning. The pain is moderate. There has been no fever. She is Sexually active. There is No history of pyelonephritis. Associated symptoms include frequency. Pertinent negatives include no hematuria.  Breast cancer - new diagnosis with lumpectomy planned for 10/27 at [facility].  She stopped OCPs and is now using condoms and wonders if this triggered the UTI.  Lab Results  Component Value Date   CREATININE 0.91 03/11/2020   BUN 9 03/11/2020   NA 139 03/11/2020   K 5.0 03/11/2020   CL 101 03/11/2020   CO2 21 03/11/2020   Lab Results  Component Value Date   CHOL 186 03/11/2020   HDL 41 03/11/2020   LDLCALC 120 (H) 03/11/2020   TRIG 140 03/11/2020   CHOLHDL 4.5 (H) 03/11/2020   Lab Results  Component Value Date   TSH 3.080 03/11/2020   Lab Results  Component Value Date   HGBA1C 5.2 02/23/2017   Lab Results  Component Value Date   WBC 9.1 03/11/2020   HGB 14.4 03/11/2020   HCT 42.7 03/11/2020   MCV 94 03/11/2020   PLT 363 03/11/2020   Lab Results  Component Value Date   ALT 15 03/11/2020   AST 14 03/11/2020   ALKPHOS 94 03/11/2020   BILITOT 0.3 03/11/2020     Review of Systems  Constitutional:  Negative for fatigue and fever.  Respiratory:  Negative for chest tightness and shortness of breath.   Cardiovascular:  Negative for chest pain.  Gastrointestinal:  Negative for abdominal pain.  Genitourinary:  Positive for dysuria and frequency. Negative for difficulty urinating, hematuria, vaginal discharge and vaginal pain.  Psychiatric/Behavioral:  Negative for sleep disturbance. The patient is not nervous/anxious.    Patient Active  Problem List   Diagnosis Date Noted   Malignant neoplasm of right female breast ([place]) 10/01/2020   OAB (overactive bladder) 01/05/2018   Urge incontinence of urine 01/05/2018   Dyslipidemia 07/16/2014   Migraine without aura and responsive to treatment 07/16/2014   Chronic schizoaffective disorder ([place]) 07/16/2014    No Known Allergies  Past Surgical History:  Procedure Laterality Date   BASAL CELL CARCINOMA EXCISION     BREAST BIOPSY Right 09/30/2020   u/s bx-"ribbon" clip-path pending   MOHS SURGERY  2012    Social History   Tobacco Use   Smoking status: Never   Smokeless tobacco: Never  Vaping Use   Vaping Use: Never used  Substance Use Topics   Alcohol use: Yes    Alcohol/week: 0.0 standard drinks    Comment: occasional   Drug use: No     Medication list has been reviewed and updated.  Current Meds  Medication Sig   divalproex (DEPAKOTE ER) 500 MG 24 hr tablet Take 3 tablets (1,500 mg total) by mouth daily.   FIBER PO Take by mouth.   nitrofurantoin, macrocrystal-monohydrate, (MACROBID) 100 MG capsule Take 1 capsule (100 mg total) by mouth 2 (two) times daily for 7 days.   oxybutynin (DITROPAN-XL) 5 MG 24 hr tablet TAKE 1 TABLET(5 MG) BY MOUTH AT BEDTIME   Probiotic Product (PROBIOTIC PO) Take by mouth.  risperiDONE (RISPERDAL) 1 MG tablet TAKE 1 TABLET AT BEDTIME   zolmitriptan (ZOMIG-ZMT) 5 MG disintegrating tablet Take 1 tablet (5 mg total) by mouth daily as needed.    PHQ 2/9 Scores 10/29/2020 06/25/2020 03/11/2020 03/01/2019  PHQ - 2 Score 1 0 0 0  PHQ- 9 Score 3 0 0 0    GAD 7 : Generalized Anxiety Score 10/29/2020 06/25/2020 03/11/2020  Nervous, Anxious, on Edge 1 0 0  Control/stop worrying 1 0 0  Worry too much - different things 1 0 0  Trouble relaxing 1 0 0  Restless 0 0 0  Easily annoyed or irritable 1 0 0  Afraid - awful might happen 1 0 0  Total GAD 7 Score 6 0 0  Anxiety Difficulty Not difficult at all Not difficult at all -    BP Readings  from Last 3 Encounters:  10/29/20 118/78  06/17/20 119/76  03/27/20 122/70    Physical Exam Vitals and nursing note reviewed.  Constitutional:      General: She is not in acute distress.    Appearance: She is well-developed.  HENT:     Head: Normocephalic and atraumatic.  Cardiovascular:     Rate and Rhythm: Normal rate and regular rhythm.     Pulses: Normal pulses.  Pulmonary:     Effort: Pulmonary effort is normal. No respiratory distress.     Breath sounds: No wheezing or rhonchi.  Abdominal:     Palpations: Abdomen is soft.     Tenderness: There is no abdominal tenderness. There is no right CVA tenderness or left CVA tenderness.  Skin:    General: Skin is warm and dry.     Findings: No rash.  Neurological:     Mental Status: She is alert and oriented to person, place, and time.  Psychiatric:        Mood and Affect: Mood normal.        Behavior: Behavior normal.    Wt Readings from Last 3 Encounters:  10/29/20 216 lb (98 kg)  06/17/20 215 lb (97.5 kg)  03/27/20 219 lb 3.2 oz (99.4 kg)    BP 118/78   Pulse 68   Temp 97.8 F (36.6 C) (Oral)   Ht 5\' 9"  (1.753 m)   Wt 216 lb (98 kg)   SpO2 98%   BMI 31.90 kg/m   Assessment and Plan: 1. Acute cystitis without hematuria Continue to increase fluids Follow up if no improvement - POC urinalysis w microscopic (non auto) - nitrofurantoin, macrocrystal-monohydrate, (MACROBID) 100 MG capsule; Take 1 capsule (100 mg total) by mouth 2 (two) times daily for 7 days.  Dispense: 14 capsule; Refill: 0  2. Malignant neoplasm of right female breast, unspecified estrogen receptor status, unspecified site of breast [facility]) Surgery planned for the end of the month   Partially dictated using [place]. Any errors are unintentional.  Halina Maidens, MD [facility]  10/29/2020

## 2020-11-04 DIAGNOSIS — Z6831 Body mass index (BMI) 31.0-31.9, adult: Secondary | ICD-10-CM | POA: Diagnosis not present

## 2020-11-04 DIAGNOSIS — Z170 Estrogen receptor positive status [ER+]: Secondary | ICD-10-CM | POA: Diagnosis not present

## 2020-11-04 DIAGNOSIS — Z808 Family history of malignant neoplasm of other organs or systems: Secondary | ICD-10-CM | POA: Diagnosis not present

## 2020-11-04 DIAGNOSIS — C50211 Malignant neoplasm of upper-inner quadrant of right female breast: Secondary | ICD-10-CM | POA: Diagnosis not present

## 2020-11-13 DIAGNOSIS — C50211 Malignant neoplasm of upper-inner quadrant of right female breast: Secondary | ICD-10-CM | POA: Diagnosis not present

## 2020-11-13 DIAGNOSIS — Z170 Estrogen receptor positive status [ER+]: Secondary | ICD-10-CM | POA: Diagnosis not present

## 2020-11-14 DIAGNOSIS — Z79899 Other long term (current) drug therapy: Secondary | ICD-10-CM | POA: Diagnosis not present

## 2020-11-14 DIAGNOSIS — R519 Headache, unspecified: Secondary | ICD-10-CM | POA: Diagnosis not present

## 2020-11-14 DIAGNOSIS — Z6831 Body mass index (BMI) 31.0-31.9, adult: Secondary | ICD-10-CM | POA: Diagnosis not present

## 2020-11-14 DIAGNOSIS — E669 Obesity, unspecified: Secondary | ICD-10-CM | POA: Diagnosis not present

## 2020-11-14 DIAGNOSIS — Z170 Estrogen receptor positive status [ER+]: Secondary | ICD-10-CM | POA: Diagnosis not present

## 2020-11-14 DIAGNOSIS — Z808 Family history of malignant neoplasm of other organs or systems: Secondary | ICD-10-CM | POA: Diagnosis not present

## 2020-11-14 DIAGNOSIS — C50911 Malignant neoplasm of unspecified site of right female breast: Secondary | ICD-10-CM | POA: Diagnosis not present

## 2020-11-14 DIAGNOSIS — C50211 Malignant neoplasm of upper-inner quadrant of right female breast: Secondary | ICD-10-CM | POA: Diagnosis not present

## 2020-11-20 ENCOUNTER — Other Ambulatory Visit: Payer: Self-pay | Admitting: Internal Medicine

## 2020-11-20 DIAGNOSIS — F259 Schizoaffective disorder, unspecified: Secondary | ICD-10-CM

## 2020-11-20 NOTE — Telephone Encounter (Signed)
Requested medications are due for refill today.  yes  Requested medications are on the active medications list.  yes  Last refill. 11/27/2019  Future visit scheduled.   yes  Notes to clinic.  Medication not delegated.

## 2020-11-21 DIAGNOSIS — Z170 Estrogen receptor positive status [ER+]: Secondary | ICD-10-CM | POA: Diagnosis not present

## 2020-11-21 DIAGNOSIS — C50211 Malignant neoplasm of upper-inner quadrant of right female breast: Secondary | ICD-10-CM | POA: Diagnosis not present

## 2020-11-28 DIAGNOSIS — Z9011 Acquired absence of right breast and nipple: Secondary | ICD-10-CM | POA: Diagnosis not present

## 2020-11-28 DIAGNOSIS — Z79899 Other long term (current) drug therapy: Secondary | ICD-10-CM | POA: Diagnosis not present

## 2020-11-28 DIAGNOSIS — Z170 Estrogen receptor positive status [ER+]: Secondary | ICD-10-CM | POA: Diagnosis not present

## 2020-11-28 DIAGNOSIS — Z6832 Body mass index (BMI) 32.0-32.9, adult: Secondary | ICD-10-CM | POA: Diagnosis not present

## 2020-11-28 DIAGNOSIS — Z510 Encounter for antineoplastic radiation therapy: Secondary | ICD-10-CM | POA: Diagnosis not present

## 2020-11-28 DIAGNOSIS — Z808 Family history of malignant neoplasm of other organs or systems: Secondary | ICD-10-CM | POA: Diagnosis not present

## 2020-11-28 DIAGNOSIS — F259 Schizoaffective disorder, unspecified: Secondary | ICD-10-CM | POA: Diagnosis not present

## 2020-11-28 DIAGNOSIS — F419 Anxiety disorder, unspecified: Secondary | ICD-10-CM | POA: Diagnosis not present

## 2020-11-28 DIAGNOSIS — Z8041 Family history of malignant neoplasm of ovary: Secondary | ICD-10-CM | POA: Diagnosis not present

## 2020-11-28 DIAGNOSIS — E785 Hyperlipidemia, unspecified: Secondary | ICD-10-CM | POA: Diagnosis not present

## 2020-11-28 DIAGNOSIS — C50911 Malignant neoplasm of unspecified site of right female breast: Secondary | ICD-10-CM | POA: Diagnosis not present

## 2020-11-28 DIAGNOSIS — C50211 Malignant neoplasm of upper-inner quadrant of right female breast: Secondary | ICD-10-CM | POA: Diagnosis not present

## 2020-12-05 DIAGNOSIS — Z79899 Other long term (current) drug therapy: Secondary | ICD-10-CM | POA: Diagnosis not present

## 2020-12-05 DIAGNOSIS — F259 Schizoaffective disorder, unspecified: Secondary | ICD-10-CM | POA: Diagnosis not present

## 2020-12-05 DIAGNOSIS — C50911 Malignant neoplasm of unspecified site of right female breast: Secondary | ICD-10-CM | POA: Diagnosis not present

## 2020-12-05 DIAGNOSIS — Z8041 Family history of malignant neoplasm of ovary: Secondary | ICD-10-CM | POA: Diagnosis not present

## 2020-12-05 DIAGNOSIS — C50211 Malignant neoplasm of upper-inner quadrant of right female breast: Secondary | ICD-10-CM | POA: Diagnosis not present

## 2020-12-05 DIAGNOSIS — C50811 Malignant neoplasm of overlapping sites of right female breast: Secondary | ICD-10-CM | POA: Diagnosis not present

## 2020-12-05 DIAGNOSIS — Z9011 Acquired absence of right breast and nipple: Secondary | ICD-10-CM | POA: Diagnosis not present

## 2020-12-05 DIAGNOSIS — Z510 Encounter for antineoplastic radiation therapy: Secondary | ICD-10-CM | POA: Diagnosis not present

## 2020-12-05 DIAGNOSIS — F419 Anxiety disorder, unspecified: Secondary | ICD-10-CM | POA: Diagnosis not present

## 2020-12-05 DIAGNOSIS — Z170 Estrogen receptor positive status [ER+]: Secondary | ICD-10-CM | POA: Diagnosis not present

## 2020-12-05 DIAGNOSIS — Z808 Family history of malignant neoplasm of other organs or systems: Secondary | ICD-10-CM | POA: Diagnosis not present

## 2020-12-05 DIAGNOSIS — E785 Hyperlipidemia, unspecified: Secondary | ICD-10-CM | POA: Diagnosis not present

## 2020-12-10 DIAGNOSIS — Z510 Encounter for antineoplastic radiation therapy: Secondary | ICD-10-CM | POA: Diagnosis not present

## 2020-12-10 DIAGNOSIS — C50811 Malignant neoplasm of overlapping sites of right female breast: Secondary | ICD-10-CM | POA: Diagnosis not present

## 2020-12-10 DIAGNOSIS — Z170 Estrogen receptor positive status [ER+]: Secondary | ICD-10-CM | POA: Diagnosis not present

## 2020-12-10 DIAGNOSIS — C50211 Malignant neoplasm of upper-inner quadrant of right female breast: Secondary | ICD-10-CM | POA: Diagnosis not present

## 2020-12-10 DIAGNOSIS — F259 Schizoaffective disorder, unspecified: Secondary | ICD-10-CM | POA: Diagnosis not present

## 2020-12-10 DIAGNOSIS — Z79899 Other long term (current) drug therapy: Secondary | ICD-10-CM | POA: Diagnosis not present

## 2020-12-10 DIAGNOSIS — E785 Hyperlipidemia, unspecified: Secondary | ICD-10-CM | POA: Diagnosis not present

## 2020-12-10 DIAGNOSIS — F419 Anxiety disorder, unspecified: Secondary | ICD-10-CM | POA: Diagnosis not present

## 2020-12-10 DIAGNOSIS — Z9011 Acquired absence of right breast and nipple: Secondary | ICD-10-CM | POA: Diagnosis not present

## 2020-12-10 DIAGNOSIS — Z8041 Family history of malignant neoplasm of ovary: Secondary | ICD-10-CM | POA: Diagnosis not present

## 2020-12-10 DIAGNOSIS — Z808 Family history of malignant neoplasm of other organs or systems: Secondary | ICD-10-CM | POA: Diagnosis not present

## 2020-12-10 DIAGNOSIS — C50911 Malignant neoplasm of unspecified site of right female breast: Secondary | ICD-10-CM | POA: Diagnosis not present

## 2020-12-16 DIAGNOSIS — C50911 Malignant neoplasm of unspecified site of right female breast: Secondary | ICD-10-CM | POA: Diagnosis not present

## 2020-12-16 DIAGNOSIS — C50211 Malignant neoplasm of upper-inner quadrant of right female breast: Secondary | ICD-10-CM | POA: Diagnosis not present

## 2020-12-16 DIAGNOSIS — Z9011 Acquired absence of right breast and nipple: Secondary | ICD-10-CM | POA: Diagnosis not present

## 2020-12-16 DIAGNOSIS — Z79899 Other long term (current) drug therapy: Secondary | ICD-10-CM | POA: Diagnosis not present

## 2020-12-16 DIAGNOSIS — F419 Anxiety disorder, unspecified: Secondary | ICD-10-CM | POA: Diagnosis not present

## 2020-12-16 DIAGNOSIS — Z170 Estrogen receptor positive status [ER+]: Secondary | ICD-10-CM | POA: Diagnosis not present

## 2020-12-16 DIAGNOSIS — F259 Schizoaffective disorder, unspecified: Secondary | ICD-10-CM | POA: Diagnosis not present

## 2020-12-16 DIAGNOSIS — C50811 Malignant neoplasm of overlapping sites of right female breast: Secondary | ICD-10-CM | POA: Diagnosis not present

## 2020-12-16 DIAGNOSIS — Z808 Family history of malignant neoplasm of other organs or systems: Secondary | ICD-10-CM | POA: Diagnosis not present

## 2020-12-16 DIAGNOSIS — E785 Hyperlipidemia, unspecified: Secondary | ICD-10-CM | POA: Diagnosis not present

## 2020-12-16 DIAGNOSIS — Z510 Encounter for antineoplastic radiation therapy: Secondary | ICD-10-CM | POA: Diagnosis not present

## 2020-12-16 DIAGNOSIS — Z8041 Family history of malignant neoplasm of ovary: Secondary | ICD-10-CM | POA: Diagnosis not present

## 2020-12-17 DIAGNOSIS — F419 Anxiety disorder, unspecified: Secondary | ICD-10-CM | POA: Diagnosis not present

## 2020-12-17 DIAGNOSIS — E785 Hyperlipidemia, unspecified: Secondary | ICD-10-CM | POA: Diagnosis not present

## 2020-12-17 DIAGNOSIS — Z510 Encounter for antineoplastic radiation therapy: Secondary | ICD-10-CM | POA: Diagnosis not present

## 2020-12-17 DIAGNOSIS — Z79899 Other long term (current) drug therapy: Secondary | ICD-10-CM | POA: Diagnosis not present

## 2020-12-17 DIAGNOSIS — Z8041 Family history of malignant neoplasm of ovary: Secondary | ICD-10-CM | POA: Diagnosis not present

## 2020-12-17 DIAGNOSIS — C50811 Malignant neoplasm of overlapping sites of right female breast: Secondary | ICD-10-CM | POA: Diagnosis not present

## 2020-12-17 DIAGNOSIS — Z9011 Acquired absence of right breast and nipple: Secondary | ICD-10-CM | POA: Diagnosis not present

## 2020-12-17 DIAGNOSIS — Z170 Estrogen receptor positive status [ER+]: Secondary | ICD-10-CM | POA: Diagnosis not present

## 2020-12-17 DIAGNOSIS — F259 Schizoaffective disorder, unspecified: Secondary | ICD-10-CM | POA: Diagnosis not present

## 2020-12-17 DIAGNOSIS — C50211 Malignant neoplasm of upper-inner quadrant of right female breast: Secondary | ICD-10-CM | POA: Diagnosis not present

## 2020-12-17 DIAGNOSIS — Z808 Family history of malignant neoplasm of other organs or systems: Secondary | ICD-10-CM | POA: Diagnosis not present

## 2020-12-17 DIAGNOSIS — C50911 Malignant neoplasm of unspecified site of right female breast: Secondary | ICD-10-CM | POA: Diagnosis not present

## 2020-12-18 DIAGNOSIS — Z170 Estrogen receptor positive status [ER+]: Secondary | ICD-10-CM | POA: Diagnosis not present

## 2020-12-18 DIAGNOSIS — Z510 Encounter for antineoplastic radiation therapy: Secondary | ICD-10-CM | POA: Diagnosis not present

## 2020-12-18 DIAGNOSIS — C50811 Malignant neoplasm of overlapping sites of right female breast: Secondary | ICD-10-CM | POA: Diagnosis not present

## 2020-12-18 DIAGNOSIS — C50211 Malignant neoplasm of upper-inner quadrant of right female breast: Secondary | ICD-10-CM | POA: Diagnosis not present

## 2020-12-18 DIAGNOSIS — E785 Hyperlipidemia, unspecified: Secondary | ICD-10-CM | POA: Diagnosis not present

## 2020-12-18 DIAGNOSIS — C50911 Malignant neoplasm of unspecified site of right female breast: Secondary | ICD-10-CM | POA: Diagnosis not present

## 2020-12-18 DIAGNOSIS — Z9011 Acquired absence of right breast and nipple: Secondary | ICD-10-CM | POA: Diagnosis not present

## 2020-12-18 DIAGNOSIS — F419 Anxiety disorder, unspecified: Secondary | ICD-10-CM | POA: Diagnosis not present

## 2020-12-18 DIAGNOSIS — Z8041 Family history of malignant neoplasm of ovary: Secondary | ICD-10-CM | POA: Diagnosis not present

## 2020-12-18 DIAGNOSIS — Z808 Family history of malignant neoplasm of other organs or systems: Secondary | ICD-10-CM | POA: Diagnosis not present

## 2020-12-18 DIAGNOSIS — F259 Schizoaffective disorder, unspecified: Secondary | ICD-10-CM | POA: Diagnosis not present

## 2020-12-18 DIAGNOSIS — Z79899 Other long term (current) drug therapy: Secondary | ICD-10-CM | POA: Diagnosis not present

## 2020-12-19 DIAGNOSIS — C50811 Malignant neoplasm of overlapping sites of right female breast: Secondary | ICD-10-CM | POA: Diagnosis not present

## 2020-12-19 DIAGNOSIS — C50211 Malignant neoplasm of upper-inner quadrant of right female breast: Secondary | ICD-10-CM | POA: Diagnosis not present

## 2020-12-20 DIAGNOSIS — C50211 Malignant neoplasm of upper-inner quadrant of right female breast: Secondary | ICD-10-CM | POA: Diagnosis not present

## 2020-12-23 DIAGNOSIS — C50811 Malignant neoplasm of overlapping sites of right female breast: Secondary | ICD-10-CM | POA: Diagnosis not present

## 2020-12-23 DIAGNOSIS — C50211 Malignant neoplasm of upper-inner quadrant of right female breast: Secondary | ICD-10-CM | POA: Diagnosis not present

## 2020-12-24 DIAGNOSIS — C50811 Malignant neoplasm of overlapping sites of right female breast: Secondary | ICD-10-CM | POA: Diagnosis not present

## 2020-12-24 DIAGNOSIS — C50211 Malignant neoplasm of upper-inner quadrant of right female breast: Secondary | ICD-10-CM | POA: Diagnosis not present

## 2020-12-25 DIAGNOSIS — C50211 Malignant neoplasm of upper-inner quadrant of right female breast: Secondary | ICD-10-CM | POA: Diagnosis not present

## 2020-12-25 DIAGNOSIS — C50811 Malignant neoplasm of overlapping sites of right female breast: Secondary | ICD-10-CM | POA: Diagnosis not present

## 2020-12-26 DIAGNOSIS — C50211 Malignant neoplasm of upper-inner quadrant of right female breast: Secondary | ICD-10-CM | POA: Diagnosis not present

## 2020-12-26 DIAGNOSIS — C50811 Malignant neoplasm of overlapping sites of right female breast: Secondary | ICD-10-CM | POA: Diagnosis not present

## 2020-12-27 DIAGNOSIS — C50211 Malignant neoplasm of upper-inner quadrant of right female breast: Secondary | ICD-10-CM | POA: Diagnosis not present

## 2020-12-27 DIAGNOSIS — C50811 Malignant neoplasm of overlapping sites of right female breast: Secondary | ICD-10-CM | POA: Diagnosis not present

## 2020-12-30 DIAGNOSIS — C50811 Malignant neoplasm of overlapping sites of right female breast: Secondary | ICD-10-CM | POA: Diagnosis not present

## 2020-12-30 DIAGNOSIS — C50211 Malignant neoplasm of upper-inner quadrant of right female breast: Secondary | ICD-10-CM | POA: Diagnosis not present

## 2020-12-31 DIAGNOSIS — C50211 Malignant neoplasm of upper-inner quadrant of right female breast: Secondary | ICD-10-CM | POA: Diagnosis not present

## 2020-12-31 DIAGNOSIS — C50811 Malignant neoplasm of overlapping sites of right female breast: Secondary | ICD-10-CM | POA: Diagnosis not present

## 2021-01-01 DIAGNOSIS — C50811 Malignant neoplasm of overlapping sites of right female breast: Secondary | ICD-10-CM | POA: Diagnosis not present

## 2021-01-01 DIAGNOSIS — C50211 Malignant neoplasm of upper-inner quadrant of right female breast: Secondary | ICD-10-CM | POA: Diagnosis not present

## 2021-01-02 DIAGNOSIS — C50811 Malignant neoplasm of overlapping sites of right female breast: Secondary | ICD-10-CM | POA: Diagnosis not present

## 2021-01-02 DIAGNOSIS — C50211 Malignant neoplasm of upper-inner quadrant of right female breast: Secondary | ICD-10-CM | POA: Diagnosis not present

## 2021-01-03 DIAGNOSIS — C50811 Malignant neoplasm of overlapping sites of right female breast: Secondary | ICD-10-CM | POA: Diagnosis not present

## 2021-01-03 DIAGNOSIS — C50211 Malignant neoplasm of upper-inner quadrant of right female breast: Secondary | ICD-10-CM | POA: Diagnosis not present

## 2021-01-06 DIAGNOSIS — C50211 Malignant neoplasm of upper-inner quadrant of right female breast: Secondary | ICD-10-CM | POA: Diagnosis not present

## 2021-01-06 DIAGNOSIS — C50811 Malignant neoplasm of overlapping sites of right female breast: Secondary | ICD-10-CM | POA: Diagnosis not present

## 2021-01-07 DIAGNOSIS — C50811 Malignant neoplasm of overlapping sites of right female breast: Secondary | ICD-10-CM | POA: Diagnosis not present

## 2021-01-07 DIAGNOSIS — C50211 Malignant neoplasm of upper-inner quadrant of right female breast: Secondary | ICD-10-CM | POA: Diagnosis not present

## 2021-01-08 DIAGNOSIS — C50211 Malignant neoplasm of upper-inner quadrant of right female breast: Secondary | ICD-10-CM | POA: Diagnosis not present

## 2021-01-09 DIAGNOSIS — C50211 Malignant neoplasm of upper-inner quadrant of right female breast: Secondary | ICD-10-CM | POA: Diagnosis not present

## 2021-01-10 DIAGNOSIS — C50211 Malignant neoplasm of upper-inner quadrant of right female breast: Secondary | ICD-10-CM | POA: Diagnosis not present

## 2021-01-23 DIAGNOSIS — Z923 Personal history of irradiation: Secondary | ICD-10-CM | POA: Diagnosis not present

## 2021-01-23 DIAGNOSIS — Z6831 Body mass index (BMI) 31.0-31.9, adult: Secondary | ICD-10-CM | POA: Diagnosis not present

## 2021-01-23 DIAGNOSIS — Z79899 Other long term (current) drug therapy: Secondary | ICD-10-CM | POA: Diagnosis not present

## 2021-01-23 DIAGNOSIS — Z9011 Acquired absence of right breast and nipple: Secondary | ICD-10-CM | POA: Diagnosis not present

## 2021-01-23 DIAGNOSIS — F259 Schizoaffective disorder, unspecified: Secondary | ICD-10-CM | POA: Diagnosis not present

## 2021-01-23 DIAGNOSIS — Z170 Estrogen receptor positive status [ER+]: Secondary | ICD-10-CM | POA: Diagnosis not present

## 2021-01-23 DIAGNOSIS — E785 Hyperlipidemia, unspecified: Secondary | ICD-10-CM | POA: Diagnosis not present

## 2021-01-23 DIAGNOSIS — C50911 Malignant neoplasm of unspecified site of right female breast: Secondary | ICD-10-CM | POA: Diagnosis not present

## 2021-01-23 DIAGNOSIS — F419 Anxiety disorder, unspecified: Secondary | ICD-10-CM | POA: Diagnosis not present

## 2021-02-05 DIAGNOSIS — D2262 Melanocytic nevi of left upper limb, including shoulder: Secondary | ICD-10-CM | POA: Diagnosis not present

## 2021-02-05 DIAGNOSIS — D225 Melanocytic nevi of trunk: Secondary | ICD-10-CM | POA: Diagnosis not present

## 2021-02-05 DIAGNOSIS — Z85828 Personal history of other malignant neoplasm of skin: Secondary | ICD-10-CM | POA: Diagnosis not present

## 2021-02-05 DIAGNOSIS — D2261 Melanocytic nevi of right upper limb, including shoulder: Secondary | ICD-10-CM | POA: Diagnosis not present

## 2021-02-18 ENCOUNTER — Other Ambulatory Visit: Payer: Self-pay | Admitting: Internal Medicine

## 2021-02-18 DIAGNOSIS — F259 Schizoaffective disorder, unspecified: Secondary | ICD-10-CM

## 2021-02-18 NOTE — Telephone Encounter (Signed)
Requested medications are due for refill today.  yes  Requested medications are on the active medications list.  yes  Last refill. 11/20/2020  Future visit scheduled.   yes  Notes to clinic.  Medication not delegated.    Requested Prescriptions  Pending Prescriptions Disp Refills   risperiDONE (RISPERDAL) 1 MG tablet [Pharmacy Med Name: RISPERIDONE TABS 1MG ] 90 tablet 3    Sig: TAKE 1 TABLET AT BEDTIME     Not Delegated - Psychiatry:  Antipsychotics - Second Generation (Atypical) - risperidone Failed - 02/18/2021 12:08 AM      Failed - This refill cannot be delegated      Failed - Prolactin Level (serum) in normal range and within 180 days    No results found for: PROLACTIN, TOTPROLACTIN, LABPROL        Failed - ALT in normal range and within 180 days    ALT  Date Value Ref Range Status  03/11/2020 15 0 - 32 IU/L Final          Failed - AST in normal range and within 180 days    AST  Date Value Ref Range Status  03/11/2020 14 0 - 40 IU/L Final          Passed - Valid encounter within last 6 months    Recent Outpatient Visits           3 months ago Acute cystitis without hematuria   [facility] [name], MD   7 months ago Irritant contact dermatitis due to plants, except food   [facility] [name], MD   11 months ago Annual physical exam   [facility] [name], MD   1 year ago Annual physical exam   [facility] [name], MD   2 years ago Vaginal discharge   [facility] [name], MD       Future Appointments             In 1 month Army Melia, Jesse Sans, MD [facility]

## 2021-03-01 ENCOUNTER — Other Ambulatory Visit: Payer: Self-pay

## 2021-03-01 ENCOUNTER — Ambulatory Visit (INDEPENDENT_AMBULATORY_CARE_PROVIDER_SITE_OTHER): Payer: BC Managed Care – PPO

## 2021-03-01 ENCOUNTER — Ambulatory Visit
Admission: EM | Admit: 2021-03-01 | Discharge: 2021-03-01 | Disposition: A | Discharge status: Home / Self Care | Payer: BC Managed Care – PPO

## 2021-03-01 DIAGNOSIS — R051 Acute cough: Secondary | ICD-10-CM | POA: Diagnosis not present

## 2021-03-01 DIAGNOSIS — J069 Acute upper respiratory infection, unspecified: Secondary | ICD-10-CM | POA: Diagnosis not present

## 2021-03-01 DIAGNOSIS — R509 Fever, unspecified: Secondary | ICD-10-CM | POA: Diagnosis not present

## 2021-03-01 DIAGNOSIS — R059 Cough, unspecified: Secondary | ICD-10-CM | POA: Diagnosis not present

## 2021-03-01 MED ORDER — PROMETHAZINE-DM 6.25-15 MG/5ML PO SYRP: 5.0000 mL | ORAL_SOLUTION | Freq: Four times a day (QID) | ORAL | 0 refills | Status: DC | PRN

## 2021-03-01 MED ORDER — IPRATROPIUM BROMIDE 0.06 % NA SOLN: 2.0000 | Freq: Four times a day (QID) | NASAL | 12 refills | Status: DC

## 2021-03-01 MED ORDER — AMOXICILLIN-POT CLAVULANATE 875-125 MG PO TABS: 1.0000 | ORAL_TABLET | Freq: Two times a day (BID) | ORAL | 0 refills | Status: AC

## 2021-03-01 MED ORDER — BENZONATATE 100 MG PO CAPS: 200.0000 mg | ORAL_CAPSULE | Freq: Three times a day (TID) | ORAL | 0 refills | Status: DC

## 2021-03-01 NOTE — ED Provider Notes (Signed)
[facility]    CSN: [id number] Arrival date & time: 03/01/21  1054      History   Chief Complaint Chief Complaint  Patient presents with   Cough    HPI [facility] is a 51 y.o. female.   HPI  51 year old female here for evaluation of respiratory complaints.  Patient reports that for the last 2 weeks she has been experiencing a cough that is productive but she says she cannot expectorate the sputum.  This has been associated with intermittent headaches, runny nose and nasal congestion, and body aches.  She also states that 7 days ago she had a fever and then she had a fever again today with a Tmax of 100.6.  She denies any ear pain, sore throat, shortness of breath or wheezing, or GI complaints.  Patient does have a past medical history significant for migraine, chronic schizoaffective disorder, and right breast cancer.  Past Medical History:  Diagnosis Date   Basal cell carcinoma 2011,2014   forehead   Cancer ([place])    skin ca   Knee strain, right, initial encounter 02/20/2016   Migraine    usually with her menses   Schizoaffective disorder [facility])     Patient Active Problem List   Diagnosis Date Noted   Malignant neoplasm of right female breast ([place]) 10/01/2020   OAB (overactive bladder) 01/05/2018   Urge incontinence of urine 01/05/2018   Dyslipidemia 07/16/2014   Migraine without aura and responsive to treatment 07/16/2014   Chronic schizoaffective disorder ([place]) 07/16/2014    Past Surgical History:  Procedure Laterality Date   BASAL CELL CARCINOMA EXCISION     BREAST BIOPSY Right 09/30/2020   u/s bx-"ribbon" clip-path pending   MOHS SURGERY  2012    OB History     Gravida  2   Para  2   Term  2   Preterm      AB      Living  2      SAB      IAB      Ectopic      Multiple      Live Births  2            Home Medications    Prior to Admission medications   Medication Sig Start Date End Date Taking? Authorizing  Provider  amoxicillin-clavulanate (AUGMENTIN) 875-125 MG tablet Take 1 tablet by mouth every 12 (twelve) hours for 10 days. 03/01/21 03/11/21 Yes Margarette Canada, NP  benzonatate (TESSALON) 100 MG capsule Take 2 capsules (200 mg total) by mouth every 8 (eight) hours. 03/01/21  Yes Margarette Canada, NP  divalproex (DEPAKOTE ER) 500 MG 24 hr tablet Take 3 tablets (1,500 mg total) by mouth daily. 03/11/20  Yes [name], MD  FIBER PO Take by mouth.   Yes [provider]  ipratropium (ATROVENT) 0.06 % nasal spray Place 2 sprays into both nostrils 4 (four) times daily. 03/01/21  Yes Margarette Canada, NP  Probiotic Product (PROBIOTIC PO) Take by mouth.   Yes [provider]  promethazine-dextromethorphan (PROMETHAZINE-DM) 6.25-15 MG/5ML syrup Take 5 mLs by mouth 4 (four) times daily as needed. 03/01/21  Yes Margarette Canada, NP  risperiDONE (RISPERDAL) 1 MG tablet TAKE 1 TABLET AT BEDTIME 02/18/21  Yes [name], MD  tamoxifen (NOLVADEX) 20 MG tablet Take 20 mg by mouth daily. 01/24/21  Yes [provider]    Family History Family History  Problem Relation Age of Onset  Cancer Father        brain   Breast cancer Cousin 25   Colon cancer Maternal Grandmother 97   Ovarian cancer Maternal Grandmother 97       mets to ovaries, not primary site    Social History Social History   Tobacco Use   Smoking status: Never   Smokeless tobacco: Never  Vaping Use   Vaping Use: Never used  Substance Use Topics   Alcohol use: Yes    Alcohol/week: 0.0 standard drinks    Comment: occasional   Drug use: No     Allergies   Patient has no known allergies.   Review of Systems Review of Systems  Constitutional:  Positive for fever. Negative for activity change and appetite change.  HENT:  Positive for congestion and rhinorrhea. Negative for ear pain and sore throat.   Respiratory:  Positive for cough. Negative for shortness of breath and wheezing.   Gastrointestinal:  Negative  for diarrhea, nausea and vomiting.  Musculoskeletal:  Positive for arthralgias and myalgias.  Skin:  Negative for rash.  Neurological:  Positive for headaches.  Hematological: Negative.   Psychiatric/Behavioral: Negative.      Physical Exam Triage Vital Signs ED Triage Vitals  Enc Vitals Group     BP 03/01/21 1247 133/81     Pulse Rate 03/01/21 1247 96     Resp 03/01/21 1247 18     Temp 03/01/21 1247 98.9 F (37.2 C)     Temp Source 03/01/21 1247 Oral     SpO2 03/01/21 1247 99 %     Weight 03/01/21 1245 215 lb (97.5 kg)     Height 03/01/21 1245 5\' 9"  (1.753 m)     Head Circumference --      Peak Flow --      Pain Score 03/01/21 1245 0     Pain Loc --      Pain Edu? --      Excl. in [place]? --    No data found.  Updated Vital Signs BP 133/81 (BP Location: Left Arm)    Pulse 96    Temp 98.9 F (37.2 C) (Oral)    Resp 18    Ht 5\' 9"  (1.753 m)    Wt 215 lb (97.5 kg)    LMP 02/07/2021    SpO2 99%    BMI 31.75 kg/m   Visual Acuity Right Eye Distance:   Left Eye Distance:   Bilateral Distance:    Right Eye Near:   Left Eye Near:    Bilateral Near:     Physical Exam Vitals and nursing note reviewed.  Constitutional:      Appearance: Normal appearance. She is not ill-appearing.  HENT:     Head: Normocephalic and atraumatic.     Right Ear: Tympanic membrane, ear canal and external ear normal. There is no impacted cerumen.     Left Ear: Tympanic membrane, ear canal and external ear normal. There is no impacted cerumen.     Nose: Congestion and rhinorrhea present.     Mouth/Throat:     Mouth: Mucous membranes are moist.     Pharynx: Oropharynx is clear. No posterior oropharyngeal erythema.  Cardiovascular:     Rate and Rhythm: Normal rate and regular rhythm.     Pulses: Normal pulses.     Heart sounds: Normal heart sounds. No murmur heard.   No friction rub. No gallop.  Pulmonary:     Effort: Pulmonary effort is normal.  Breath sounds: Rales present. No wheezing or  rhonchi.  Musculoskeletal:     Cervical back: Normal range of motion and neck supple.  Lymphadenopathy:     Cervical: No cervical adenopathy.  Skin:    General: Skin is warm and dry.     Capillary Refill: Capillary refill takes less than 2 seconds.     Findings: No erythema or rash.  Neurological:     General: No focal deficit present.     Mental Status: She is alert and oriented to person, place, and time.  Psychiatric:        Mood and Affect: Mood normal.        Behavior: Behavior normal.        Thought Content: Thought content normal.        Judgment: Judgment normal.     UC Treatments / Results  Labs (all labs ordered are listed, but only abnormal results are displayed) Labs Reviewed - No data to display  EKG   Radiology DG Chest 2 View  Result Date: 03/01/2021 CLINICAL DATA:  Cough and fever for 2 weeks. EXAM: CHEST - 2 VIEW COMPARISON:  None. FINDINGS: Normal heart, mediastinum and hila. Clear lungs.  No pleural effusion or pneumothorax. Skeletal structures are intact. IMPRESSION: No active cardiopulmonary disease. Electronically Signed   By: Lajean Manes M.D.   On: 03/01/2021 13:57    Procedures Procedures (including critical care time)  Medications Ordered in UC Medications - No data to display  Initial Impression / Assessment and Plan / UC Course  I have reviewed the triage vital signs and the nursing notes.  Pertinent labs & imaging results that were available during my care of the patient were reviewed by me and considered in my medical decision making (see chart for details).  Patient is a nontoxic-appearing 51 year old female here for evaluation 2 weeks worth of cough.  As mentioned in HPI above, she has had associated symptoms of runny nose and nasal congestion to a slight degree, intermittent headache, mild body aches, and a fever on 2 separate occasions with a Tmax of 100.6.  She reports a fever 1 week ago and then again this morning.  On exam patient has  pearly-gray tympanic membranes bilaterally with normal light reflex and clear external auditory canals.  Nasal mucosa is mildly erythematous and edematous with scant clear discharge in both nares.  Oropharyngeal exam is benign.  No cervical lymphadenopathy appreciable exam.  Cardiopulmonary exam reveals fine crackles in bilateral bases.  The remainder lung fields are clear.  Will obtain chest x-ray to rule out  pneumonia.  Chest x-ray independently reviewed and evaluated by me.  Impression: Pulmonary vasculature is mildly prominent but there is no definitive effusion or infiltrate noted.  Costophrenic angles are crisp.  Lung spaces are well pneumatized.  Radiology overread is pending. Radiology impression is normal heart, mediastinum, and hila clear lungs without pleural effusion or pneumothorax.  No active cardiopulmonary disease.  Given patient's 2-week history of cough, runny nose, nasal congestion, and intermittent fevers I do believe a trial of antibiotics is warranted.  I will treat her for an upper respiratory infection with Augmentin twice daily for 10 days.  We will also give Atrovent nasal spray to help with nasal congestion and Tessalon Perles and Promethazine DM cough syrup to help with cough and congestion.   Final Clinical Impressions(s) / UC Diagnoses   Final diagnoses:  Upper respiratory tract infection, unspecified type  Acute cough     Discharge Instructions  Take the Augmentin twice daily for 10 days for treatment of your upper respiratory infection.  Take this with food.  Use the Atrovent nasal spray, 2 squirts in each nostril every 6 hours, as needed for runny nose and postnasal drip.  Use the Tessalon Perles every 8 hours during the day.  Take them with a small sip of water.  They may give you some numbness to the base of your tongue or a metallic taste in your mouth, this is normal.  Use the Promethazine DM cough syrup at bedtime for cough and congestion.  It will  make you drowsy so do not take it during the day.  Return for reevaluation or see your primary care provider for any new or worsening symptoms.      ED Prescriptions     Medication Sig Dispense Auth. Provider   amoxicillin-clavulanate (AUGMENTIN) 875-125 MG tablet Take 1 tablet by mouth every 12 (twelve) hours for 10 days. 20 tablet Margarette Canada, NP   benzonatate (TESSALON) 100 MG capsule Take 2 capsules (200 mg total) by mouth every 8 (eight) hours. 21 capsule Margarette Canada, NP   ipratropium (ATROVENT) 0.06 % nasal spray Place 2 sprays into both nostrils 4 (four) times daily. 15 mL Margarette Canada, NP   promethazine-dextromethorphan (PROMETHAZINE-DM) 6.25-15 MG/5ML syrup Take 5 mLs by mouth 4 (four) times daily as needed. 118 mL Margarette Canada, NP      PDMP not reviewed this encounter.   Margarette Canada, NP 03/01/21 1404

## 2021-03-01 NOTE — ED Triage Notes (Signed)
Pt here with C/O cough for 2 weeks, headache, fever 7 days ago and today.

## 2021-03-01 NOTE — Discharge Instructions (Addendum)
Take the Augmentin twice daily for 10 days for treatment of your upper respiratory infection.  Take this with food.  Use the Atrovent nasal spray, 2 squirts in each nostril every 6 hours, as needed for runny nose and postnasal drip.  Use the Tessalon Perles every 8 hours during the day.  Take them with a small sip of water.  They may give you some numbness to the base of your tongue or a metallic taste in your mouth, this is normal.  Use the Promethazine DM cough syrup at bedtime for cough and congestion.  It will make you drowsy so do not take it during the day.  Return for reevaluation or see your primary care provider for any new or worsening symptoms.

## 2021-03-17 ENCOUNTER — Encounter: Payer: BC Managed Care – PPO | Admitting: Internal Medicine

## 2021-03-19 DIAGNOSIS — Z6830 Body mass index (BMI) 30.0-30.9, adult: Secondary | ICD-10-CM | POA: Diagnosis not present

## 2021-03-19 DIAGNOSIS — Z01419 Encounter for gynecological examination (general) (routine) without abnormal findings: Secondary | ICD-10-CM | POA: Diagnosis not present

## 2021-03-20 ENCOUNTER — Encounter: Payer: Self-pay | Admitting: Internal Medicine

## 2021-03-20 ENCOUNTER — Other Ambulatory Visit: Payer: Self-pay

## 2021-03-20 ENCOUNTER — Ambulatory Visit (INDEPENDENT_AMBULATORY_CARE_PROVIDER_SITE_OTHER): Payer: BC Managed Care – PPO | Admitting: Internal Medicine

## 2021-03-20 VITALS — BP 124/72 | HR 76 | Ht 69.0 in | Wt 207.0 lb

## 2021-03-20 DIAGNOSIS — E785 Hyperlipidemia, unspecified: Secondary | ICD-10-CM | POA: Diagnosis not present

## 2021-03-20 DIAGNOSIS — Z0000 Encounter for general adult medical examination without abnormal findings: Secondary | ICD-10-CM | POA: Diagnosis not present

## 2021-03-20 DIAGNOSIS — C50211 Malignant neoplasm of upper-inner quadrant of right female breast: Secondary | ICD-10-CM

## 2021-03-20 DIAGNOSIS — F259 Schizoaffective disorder, unspecified: Secondary | ICD-10-CM

## 2021-03-20 DIAGNOSIS — Z170 Estrogen receptor positive status [ER+]: Secondary | ICD-10-CM

## 2021-03-20 DIAGNOSIS — N3281 Overactive bladder: Secondary | ICD-10-CM

## 2021-03-20 DIAGNOSIS — Z1211 Encounter for screening for malignant neoplasm of colon: Secondary | ICD-10-CM

## 2021-03-20 MED ORDER — DIVALPROEX SODIUM ER 500 MG PO TB24: 1500.0000 mg | ORAL_TABLET | Freq: Every day | ORAL | 3 refills | Status: DC

## 2021-03-20 MED ORDER — OXYBUTYNIN CHLORIDE ER 5 MG PO TB24: 5.0000 mg | ORAL_TABLET | Freq: Every day | ORAL | 3 refills | Status: DC

## 2021-03-20 NOTE — Progress Notes (Signed)
? ? ?Date:  03/20/2021  ? ?Name:  [facility]   DOB:  [birth year]   MRN:  [id number] ? ? ?Chief Complaint: Annual Exam (No Breast Exam, No pap.) ?B[facility] is a 51 y.o. female who presents today for her Complete Annual Exam. She feels well. She reports exercising - walking. She reports she is sleeping well. Breast complaints - none. ? ?Mammogram: 08/2020 right breast mass - invasive ductal carcinoma; completed XRT 01/21/2020 and then started Tamoxifen. ?DEXA: none ?Pap smear: 01/2017 neg with co-testing ?Colonoscopy: none - due ? ?Immunization History  ?Administered Date(s) Administered  ? Influenza Inj Mdck Quad With Preservative 11/04/2017  ? Influenza Split 12/04/2019  ? Influenza,inj,Quad PF,6+ Mos 01/04/2017, 11/01/2018  ? Influenza-Unspecified 10/17/2020  ? PFIZER Comirnaty(Gray Top)Covid-19 Tri-Sucrose Vaccine 03/20/2019, 04/09/2019  ? Tdap 02/20/2016  ? ? ?HPI ? ?Lab Results  ?Component Value Date  ? NA 139 03/11/2020  ? K 5.0 03/11/2020  ? CO2 21 03/11/2020  ? GLUCOSE 85 03/11/2020  ? BUN 9 03/11/2020  ? CREATININE 0.91 03/11/2020  ? CALCIUM 9.8 03/11/2020  ? GFRNONAA 75 03/11/2020  ? ?Lab Results  ?Component Value Date  ? CHOL 186 03/11/2020  ? HDL 41 03/11/2020  ? LDLCALC 120 (H) 03/11/2020  ? TRIG 140 03/11/2020  ? CHOLHDL 4.5 (H) 03/11/2020  ? ?Lab Results  ?Component Value Date  ? TSH 3.080 03/11/2020  ? ?Lab Results  ?Component Value Date  ? HGBA1C 5.2 02/23/2017  ? ?Lab Results  ?Component Value Date  ? WBC 9.1 03/11/2020  ? HGB 14.4 03/11/2020  ? HCT 42.7 03/11/2020  ? MCV 94 03/11/2020  ? PLT 363 03/11/2020  ? ?Lab Results  ?Component Value Date  ? ALT 15 03/11/2020  ? AST 14 03/11/2020  ? ALKPHOS 94 03/11/2020  ? BILITOT 0.3 03/11/2020  ? ?No results found for: 25OHVITD2, [place], VD25OH  ? ?Review of Systems  ?Constitutional:  Negative for chills, fatigue and fever.  ?HENT:  Negative for congestion, hearing loss, tinnitus, trouble swallowing and voice change.   ?Eyes:  Negative for  visual disturbance.  ?Respiratory:  Positive for cough (resolving URI). Negative for chest tightness, shortness of breath and wheezing.   ?Cardiovascular:  Negative for chest pain, palpitations and leg swelling.  ?Gastrointestinal:  Negative for abdominal pain, constipation, diarrhea and vomiting.  ?Endocrine: Negative for polydipsia and polyuria.  ?Genitourinary:  Negative for dysuria, frequency, genital sores, vaginal bleeding and vaginal discharge.  ?Musculoskeletal:  Negative for arthralgias, gait problem and joint swelling.  ?Skin:  Negative for color change and rash.  ?Neurological:  Negative for dizziness, tremors, light-headedness and headaches.  ?Hematological:  Negative for adenopathy. Does not bruise/bleed easily.  ?Psychiatric/Behavioral:  Negative for dysphoric mood and sleep disturbance. The patient is not nervous/anxious.   ? ?Patient Active Problem List  ? Diagnosis Date Noted  ? Malignant neoplasm of upper-inner quadrant of right breast in female, estrogen receptor positive ([place]) 10/01/2020  ? OAB (overactive bladder) 01/05/2018  ? Urge incontinence of urine 01/05/2018  ? Dyslipidemia 07/16/2014  ? Migraine without aura and responsive to treatment 07/16/2014  ? Chronic schizoaffective disorder ([place]) 07/16/2014  ? ? ?No Known Allergies ? ?Past Surgical History:  ?Procedure Laterality Date  ? BASAL CELL CARCINOMA EXCISION    ? BREAST BIOPSY Right 09/30/2020  ? u/s bx-"ribbon" clip-path pending  ? MASTECTOMY, PARTIAL Right 10/2020  ? MOHS SURGERY  2012  ? ? ?Social History  ? ?Tobacco Use  ? Smoking status: Never  ?  Smokeless tobacco: Never  ?Vaping Use  ? Vaping Use: Never used  ?Substance Use Topics  ? Alcohol use: Yes  ?  Alcohol/week: 0.0 standard drinks  ?  Comment: occasional  ? Drug use: No  ? ? ? ?Medication list has been reviewed and updated. ? ?Current Meds  ?Medication Sig  ? divalproex (DEPAKOTE ER) 500 MG 24 hr tablet Take 3 tablets (1,500 mg total) by mouth daily.  ? FIBER PO Take by  mouth.  ? ipratropium (ATROVENT) 0.06 % nasal spray Place 2 sprays into both nostrils 4 (four) times daily.  ? oxybutynin (DITROPAN-XL) 5 MG 24 hr tablet Take 5 mg by mouth daily.  ? Probiotic Product (PROBIOTIC PO) Take by mouth.  ? risperiDONE (RISPERDAL) 1 MG tablet TAKE 1 TABLET AT BEDTIME  ? tamoxifen (NOLVADEX) 20 MG tablet Take 20 mg by mouth daily.  ? ? ?PHQ 2/9 Scores 03/20/2021 10/29/2020 06/25/2020 03/11/2020  ?PHQ - 2 Score 0 1 0 0  ?PHQ- 9 Score 0 3 0 0  ? ? ?GAD 7 : Generalized Anxiety Score 03/20/2021 10/29/2020 06/25/2020 03/11/2020  ?Nervous, Anxious, on Edge 0 1 0 0  ?Control/stop worrying 0 1 0 0  ?Worry too much - different things 0 1 0 0  ?Trouble relaxing 0 1 0 0  ?Restless 0 0 0 0  ?Easily annoyed or irritable 0 1 0 0  ?Afraid - awful might happen 0 1 0 0  ?Total GAD 7 Score 0 6 0 0  ?Anxiety Difficulty Not difficult at all Not difficult at all Not difficult at all -  ? ? ?BP Readings from Last 3 Encounters:  ?03/20/21 124/72  ?03/01/21 133/81  ?10/29/20 118/78  ? ? ?Physical Exam ?Vitals and nursing note reviewed.  ?Constitutional:   ?   General: She is not in acute distress. ?   Appearance: She is well-developed.  ?HENT:  ?   Head: Normocephalic and atraumatic.  ?   Right Ear: Tympanic membrane and ear canal normal.  ?   Left Ear: Tympanic membrane and ear canal normal.  ?   Nose:  ?   Right Sinus: No maxillary sinus tenderness.  ?   Left Sinus: No maxillary sinus tenderness.  ?Eyes:  ?   General: No scleral icterus.    ?   Right eye: No discharge.     ?   Left eye: No discharge.  ?   Conjunctiva/sclera: Conjunctivae normal.  ?Neck:  ?   Thyroid: No thyromegaly.  ?   Vascular: No carotid bruit.  ?Cardiovascular:  ?   Rate and Rhythm: Normal rate and regular rhythm.  ?   Pulses: Normal pulses.  ?   Heart sounds: Normal heart sounds.  ?Pulmonary:  ?   Effort: Pulmonary effort is normal. No respiratory distress.  ?   Breath sounds: Examination of the left-upper field reveals wheezing. Wheezing present.   ?   Comments: Clears with coughing ?Abdominal:  ?   General: Bowel sounds are normal.  ?   Palpations: Abdomen is soft.  ?   Tenderness: There is no abdominal tenderness.  ?Musculoskeletal:  ?   Cervical back: Normal range of motion. No erythema.  ?   Right lower leg: No edema.  ?   Left lower leg: No edema.  ?Lymphadenopathy:  ?   Cervical: No cervical adenopathy.  ?Skin: ?   General: Skin is warm and dry.  ?   Findings: No rash.  ?Neurological:  ?   Mental Status:  She is alert and oriented to person, place, and time.  ?   Cranial Nerves: No cranial nerve deficit.  ?   Sensory: No sensory deficit.  ?   Deep Tendon Reflexes: Reflexes are normal and symmetric.  ?Psychiatric:     ?   Attention and Perception: Attention normal.     ?   Mood and Affect: Mood normal.  ? ? ?Wt Readings from Last 3 Encounters:  ?03/20/21 207 lb (93.9 kg)  ?03/01/21 215 lb (97.5 kg)  ?10/29/20 216 lb (98 kg)  ? ? ?BP 124/72   Pulse 76   Ht 5\' 9"  (1.753 m)   Wt 207 lb (93.9 kg)   SpO2 97%   BMI 30.57 kg/m?  ? ?Assessment and Plan: ?1. Annual physical exam ?Normal exam. ?Up to date on screenings and immunizations. ?Continue healthy diet and regular exercise ?- CBC with Differential/Platelet ?- Comprehensive metabolic panel ?- Hemoglobin A1c ?- TSH ? ?2. Colon cancer screening ?- Ambulatory referral to Gastroenterology ? ?3. Dyslipidemia ?Check labs and advise ?- Lipid panel ? ?4. Malignant neoplasm of upper-inner quadrant of right breast in female, estrogen receptor positive ([place]) ?S/p partial mastectomy and XRT ?On Tamoxifen and tolerating it well ? ?5. Chronic schizoaffective disorder ([place]) ?Stable symptoms without medication issues ?Continue depakote and risperdol. ?- divalproex (DEPAKOTE ER) 500 MG 24 hr tablet; Take 3 tablets (1,500 mg total) by mouth daily.  Dispense: 270 tablet; Refill: 3 ? ?6. OAB (overactive bladder) ?Doing well with Ditropan daily ?- oxybutynin (DITROPAN-XL) 5 MG 24 hr tablet; Take 1 tablet (5 mg total) by  mouth daily.  Dispense: 90 tablet; Refill: 3 ? ? ?Partially dictated using Editor, commissioning. Any errors are unintentional. ? ?Halina Maidens, MD ?[facility] ?[facility] ? ?03/20/2021 ? ? ? ? ? ?

## 2021-03-20 NOTE — Progress Notes (Signed)
Patient stated she is having constipation so made in person visit first to be checked ?

## 2021-03-21 LAB — CBC WITH DIFFERENTIAL/PLATELET
Basophils Absolute: 0 10*3/uL (ref 0.0–0.2)
Basos: 0 %
EOS (ABSOLUTE): 0.1 10*3/uL (ref 0.0–0.4)
Eos: 1 %
Hematocrit: 40.7 % (ref 34.0–46.6)
Hemoglobin: 13.7 g/dL (ref 11.1–15.9)
Immature Grans (Abs): 0.1 10*3/uL (ref 0.0–0.1)
Immature Granulocytes: 1 %
Lymphocytes Absolute: 1.8 10*3/uL (ref 0.7–3.1)
Lymphs: 22 %
MCH: 32.4 pg (ref 26.6–33.0)
MCHC: 33.7 g/dL (ref 31.5–35.7)
MCV: 96 fL (ref 79–97)
Monocytes Absolute: 0.7 10*3/uL (ref 0.1–0.9)
Monocytes: 9 %
Neutrophils Absolute: 5.4 10*3/uL (ref 1.4–7.0)
Neutrophils: 67 %
Platelets: 289 10*3/uL (ref 150–450)
RBC: 4.23 x10E6/uL (ref 3.77–5.28)
RDW: 12.2 % (ref 11.7–15.4)
WBC: 8 10*3/uL (ref 3.4–10.8)

## 2021-03-21 LAB — LIPID PANEL
Chol/HDL Ratio: 3.9 ratio (ref 0.0–4.4)
Cholesterol, Total: 148 mg/dL (ref 100–199)
HDL: 38 mg/dL — ABNORMAL LOW (ref >39)
LDL Chol Calc (NIH): 84 mg/dL (ref 0–99)
Triglycerides: 150 mg/dL — ABNORMAL HIGH (ref 0–149)
VLDL Cholesterol Cal: 26 mg/dL (ref 5–40)

## 2021-03-21 LAB — COMPREHENSIVE METABOLIC PANEL
ALT: 16 IU/L (ref 0–32)
AST: 18 IU/L (ref 0–40)
Albumin/Globulin Ratio: 1.6 (ref 1.2–2.2)
Albumin: 4.1 g/dL (ref 3.8–4.8)
Alkaline Phosphatase: 72 IU/L (ref 44–121)
BUN/Creatinine Ratio: 17 (ref 9–23)
BUN: 16 mg/dL (ref 6–24)
Bilirubin Total: 0.3 mg/dL (ref 0.0–1.2)
CO2: 23 mmol/L (ref 20–29)
Calcium: 9.9 mg/dL (ref 8.7–10.2)
Chloride: 102 mmol/L (ref 96–106)
Creatinine, Ser: 0.94 mg/dL (ref 0.57–1.00)
Globulin, Total: 2.6 g/dL (ref 1.5–4.5)
Glucose: 85 mg/dL (ref 70–99)
Potassium: 4.9 mmol/L (ref 3.5–5.2)
Sodium: 139 mmol/L (ref 134–144)
Total Protein: 6.7 g/dL (ref 6.0–8.5)
eGFR: 74 mL/min/{1.73_m2} (ref >59)

## 2021-03-21 LAB — TSH: TSH: 2.95 u[IU]/mL (ref 0.450–4.500)

## 2021-03-21 LAB — HEMOGLOBIN A1C
Est. average glucose Bld gHb Est-mCnc: 108 mg/dL
Hgb A1c MFr Bld: 5.4 % (ref 4.8–5.6)

## 2021-04-09 ENCOUNTER — Ambulatory Visit (INDEPENDENT_AMBULATORY_CARE_PROVIDER_SITE_OTHER): Payer: BC Managed Care – PPO | Admitting: Internal Medicine

## 2021-04-09 ENCOUNTER — Encounter: Payer: Self-pay | Admitting: Internal Medicine

## 2021-04-09 ENCOUNTER — Other Ambulatory Visit: Payer: Self-pay

## 2021-04-09 VITALS — BP 118/60 | HR 82 | Temp 97.7°F | Ht 69.0 in | Wt 204.0 lb

## 2021-04-09 DIAGNOSIS — H6692 Otitis media, unspecified, left ear: Secondary | ICD-10-CM

## 2021-04-09 DIAGNOSIS — J209 Acute bronchitis, unspecified: Secondary | ICD-10-CM

## 2021-04-09 MED ORDER — AZITHROMYCIN 250 MG PO TABS
ORAL_TABLET | ORAL | 0 refills | Status: AC
Start: 2021-04-09 — End: 2021-04-14

## 2021-04-09 NOTE — Patient Instructions (Addendum)
Use Mucinex as directed ? ?Increase fluids. ? ?Use an inhaler if your chest gets tight. ?

## 2021-04-09 NOTE — Progress Notes (Signed)
? ? ?Date:  04/09/2021  ? ?Name:  Beth Lewis   DOB:  1970-04-12   MRN:  [id number] ? ? ?Chief Complaint: Cough ? ?Cough ?This is a new problem. The current episode started more than 1 month ago. The cough is Productive of sputum. Associated symptoms include ear congestion, nasal congestion, rhinorrhea (Green/yellow in color), shortness of breath and wheezing. Pertinent negatives include no chest pain, chills, fever, headaches, postnasal drip or sore throat.  ? ?Lab Results  ?Component Value Date  ? NA 139 03/20/2021  ? K 4.9 03/20/2021  ? CO2 23 03/20/2021  ? GLUCOSE 85 03/20/2021  ? BUN 16 03/20/2021  ? CREATININE 0.94 03/20/2021  ? CALCIUM 9.9 03/20/2021  ? EGFR 74 03/20/2021  ? GFRNONAA 75 03/11/2020  ? ?Lab Results  ?Component Value Date  ? CHOL 148 03/20/2021  ? HDL 38 (L) 03/20/2021  ? [place] 84 03/20/2021  ? TRIG 150 (H) 03/20/2021  ? CHOLHDL 3.9 03/20/2021  ? ?Lab Results  ?Component Value Date  ? TSH 2.950 03/20/2021  ? ?Lab Results  ?Component Value Date  ? HGBA1C 5.4 03/20/2021  ? ?Lab Results  ?Component Value Date  ? WBC 8.0 03/20/2021  ? HGB 13.7 03/20/2021  ? HCT 40.7 03/20/2021  ? MCV 96 03/20/2021  ? PLT 289 03/20/2021  ? ?Lab Results  ?Component Value Date  ? ALT 16 03/20/2021  ? AST 18 03/20/2021  ? ALKPHOS 72 03/20/2021  ? BILITOT 0.3 03/20/2021  ? ?No results found for: 25OHVITD2, [place], VD25OH  ? ?Review of Systems  ?Constitutional:  Negative for chills, fatigue and fever.  ?HENT:  Positive for rhinorrhea (Green/yellow in color) and sinus pressure. Negative for postnasal drip and sore throat.   ?Respiratory:  Positive for cough, shortness of breath and wheezing. Negative for chest tightness.   ?Cardiovascular:  Negative for chest pain and palpitations.  ?Neurological:  Negative for dizziness, light-headedness and headaches.  ? ?Patient Active Problem List  ? Diagnosis Date Noted  ? Malignant neoplasm of upper-inner quadrant of right breast in female, estrogen receptor positive ([place])  10/01/2020  ? OAB (overactive bladder) 01/05/2018  ? Urge incontinence of urine 01/05/2018  ? Dyslipidemia 07/16/2014  ? Migraine without aura and responsive to treatment 07/16/2014  ? Chronic schizoaffective disorder ([place]) 07/16/2014  ? ? ?No Known Allergies ? ?Past Surgical History:  ?Procedure Laterality Date  ? BASAL CELL CARCINOMA EXCISION    ? BREAST BIOPSY Right 09/30/2020  ? u/s bx-"ribbon" clip-path pending  ? MASTECTOMY, PARTIAL Right 10/2020  ? MOHS SURGERY  2012  ? ? ?Social History  ? ?Tobacco Use  ? Smoking status: Never  ? Smokeless tobacco: Never  ?Vaping Use  ? Vaping Use: Never used  ?Substance Use Topics  ? Alcohol use: Yes  ?  Alcohol/week: 0.0 standard drinks  ?  Comment: occasional  ? Drug use: No  ? ? ? ?Medication list has been reviewed and updated. ? ?Current Meds  ?Medication Sig  ? azithromycin (ZITHROMAX Z-PAK) 250 MG tablet UAD  ? divalproex (DEPAKOTE ER) 500 MG 24 hr tablet Take 3 tablets (1,500 mg total) by mouth daily.  ? FIBER PO Take by mouth.  ? ipratropium (ATROVENT) 0.06 % nasal spray Place 2 sprays into both nostrils 4 (four) times daily.  ? oxybutynin (DITROPAN-XL) 5 MG 24 hr tablet Take 1 tablet (5 mg total) by mouth daily.  ? Probiotic Product (PROBIOTIC PO) Take by mouth.  ? risperiDONE (RISPERDAL) 1 MG tablet TAKE 1  TABLET AT BEDTIME  ? tamoxifen (NOLVADEX) 20 MG tablet Take 20 mg by mouth daily.  ? ? ? ?  04/09/2021  ? 11:14 AM 03/20/2021  ?  9:05 AM 10/29/2020  ?  1:26 PM 06/25/2020  ? 11:22 AM  ?PHQ 2/9 Scores  ?PHQ - 2 Score 0 0 1 0  ?PHQ- 9 Score 0 0 3 0  ? ? ? ?  04/09/2021  ? 11:14 AM 03/20/2021  ?  9:06 AM 10/29/2020  ?  1:26 PM 06/25/2020  ? 11:22 AM  ?GAD 7 : Generalized Anxiety Score  ?Nervous, Anxious, on Edge 0 0 1 0  ?Control/stop worrying 0 0 1 0  ?Worry too much - different things 0 0 1 0  ?Trouble relaxing 0 0 1 0  ?Restless 0 0 0 0  ?Easily annoyed or irritable 0 0 1 0  ?Afraid - awful might happen 0 0 1 0  ?Total GAD 7 Score 0 0 6 0  ?Anxiety Difficulty Not  difficult at all Not difficult at all Not difficult at all Not difficult at all  ? ? ?BP Readings from Last 3 Encounters:  ?04/09/21 118/60  ?03/20/21 124/72  ?03/01/21 133/81  ? ? ?Physical Exam ?Constitutional:   ?   Appearance: She is well-developed.  ?HENT:  ?   Right Ear: Ear canal and external ear normal. Tympanic membrane is not erythematous or retracted.  ?   Left Ear: Ear canal and external ear normal. A middle ear effusion is present. Tympanic membrane is not erythematous or retracted.  ?   Nose:  ?   Right Sinus: Maxillary sinus tenderness present. No frontal sinus tenderness.  ?   Left Sinus: Maxillary sinus tenderness present. No frontal sinus tenderness.  ?   Mouth/Throat:  ?   Mouth: No oral lesions.  ?   Pharynx: Uvula midline. Posterior oropharyngeal erythema present. No oropharyngeal exudate.  ?Cardiovascular:  ?   Rate and Rhythm: Normal rate and regular rhythm.  ?   Heart sounds: Normal heart sounds.  ?Pulmonary:  ?   Breath sounds: Transmitted upper airway sounds present. Examination of the left-upper field reveals wheezing. Wheezing present. No rales.  ?Lymphadenopathy:  ?   Cervical: No cervical adenopathy.  ?Neurological:  ?   Mental Status: She is alert and oriented to person, place, and time.  ? ? ?Wt Readings from Last 3 Encounters:  ?04/09/21 204 lb (92.5 kg)  ?03/20/21 207 lb (93.9 kg)  ?03/01/21 215 lb (97.5 kg)  ? ? ?BP 118/60   Pulse 82   Temp 97.7 ?F (36.5 ?C) (Oral)   Ht '5\' 9"'  (1.753 m)   Wt 204 lb (92.5 kg)   SpO2 97%   BMI 30.13 kg/m?  ? ?Assessment and Plan: ?1. Bronchitis with bronchospasm ?Recommend Mucinex, fluids, rest as needed ?Use albuterol MDI as needed for chest tightness and wheezing ?Call if not improving after completion of antibiotics ?- azithromycin (ZITHROMAX Z-PAK) 250 MG tablet; UAD  Dispense: 6 each; Refill: 0 ? ?2. Otitis of left ear ?Pathogens should be covered by Zpak ?- azithromycin (ZITHROMAX Z-PAK) 250 MG tablet; UAD  Dispense: 6 each; Refill:  0 ? ? ?Partially dictated using Editor, commissioning. Any errors are unintentional. ? ?Halina Maidens, MD ?[facility] ?[facility] ? ?04/09/2021 ? ? ? ? ? ?

## 2021-04-14 ENCOUNTER — Ambulatory Visit: Payer: Self-pay | Admitting: *Deleted

## 2021-04-14 NOTE — Telephone Encounter (Signed)
Returned pt's call.   C/o a cough that is better but having crackling in her ears.  Wanting to know if she needs an appt or not. ? ? ?Reason for Disposition ? Ear congestion ? ?Answer Assessment - Initial Assessment Questions ?1. LOCATION: "Which ear is involved?"   ?    Both ears have crackling sounds.   Left ear worse.   I was seen 3/22 and given antibiotics.   My ears are not better.    ?Cough is better but had it since Feb.   At night not have the cough.   I'm taking the Mucinex.   It is breaking up the mucus. ?2. SENSATION: "Describe how the ear feels." (e.g. stuffy, full, plugged)."  ?    Pressure and crackling.     No pain ?3. ONSET:  "When did the ear symptoms start?"   ?    3/22 when seen in office.    ?4. PAIN: "Do you also have an earache?" If Yes, ask: "How bad is it?" (Scale 1-10; or mild, moderate, severe) ?    No   ?5. CAUSE: "What do you think is causing the ear congestion?" ?    I'm blowing my nose.   No sore throat.   Nose is clear mucus. ?6. URI: "Do you have a runny nose or cough?"  ?    Yes  Runny  ?7. NASAL ALLERGIES: "Are there symptoms of hay fever, such as sneezing or a clear nasal discharge?" ?    Not asked ?8. PREGNANCY: "Is there any chance you are pregnant?" "When was your last menstrual period?" ?    Not asked ? ?Protocols used: Ear - Congestion-A-AH ? ?

## 2021-04-14 NOTE — Telephone Encounter (Signed)
?  Chief Complaint: Crackling and fullness in ears, nasal congestion.   (Seen 3/22 and treated doing better) ?Symptoms: Crackling in both ears ?Frequency: Since been sick with URI ?Pertinent Negatives: Patient denies Fever, green mucus, or sore throat.  Cough much better after antibiotics ?Disposition: '[]'$ ED /'[]'$ Urgent Care (no appt availability in office) / '[]'$ Appointment(In office/virtual)/ '[]'$  [facility]/ '[x]'$ Home Care/ '[]'$ Refused Recommended Disposition /'[]'$ [facility]/ '[]'$  Follow-up with PCP ?Additional Notes:   ?

## 2021-04-16 ENCOUNTER — Encounter: Payer: Self-pay | Admitting: Internal Medicine

## 2021-04-16 ENCOUNTER — Other Ambulatory Visit: Payer: Self-pay | Admitting: Internal Medicine

## 2021-04-16 ENCOUNTER — Ambulatory Visit (INDEPENDENT_AMBULATORY_CARE_PROVIDER_SITE_OTHER): Payer: BC Managed Care – PPO | Admitting: Internal Medicine

## 2021-04-16 VITALS — BP 104/76 | HR 83 | Temp 97.9°F | Ht 69.0 in | Wt 205.8 lb

## 2021-04-16 DIAGNOSIS — H6693 Otitis media, unspecified, bilateral: Secondary | ICD-10-CM

## 2021-04-16 DIAGNOSIS — R051 Acute cough: Secondary | ICD-10-CM | POA: Diagnosis not present

## 2021-04-16 MED ORDER — CEFDINIR 300 MG PO CAPS: 300.0000 mg | ORAL_CAPSULE | Freq: Two times a day (BID) | ORAL | 0 refills | Status: AC

## 2021-04-16 MED ORDER — PREDNISONE 10 MG PO TABS
10.0000 mg | ORAL_TABLET | ORAL | 0 refills | Status: AC
Start: 2021-04-16 — End: 2021-04-22

## 2021-04-16 MED ORDER — ALBUTEROL SULFATE HFA 108 (90 BASE) MCG/ACT IN AERS: 2.0000 | INHALATION_SPRAY | Freq: Four times a day (QID) | RESPIRATORY_TRACT | 0 refills | Status: DC | PRN

## 2021-04-16 NOTE — Progress Notes (Signed)
? ? ?Date:  04/16/2021  ? ?Name:  Beth Lewis   DOB:  02/06/1970   MRN:  [id number] ? ? ?Chief Complaint: Ear Pain and Cough ? ?Cough ?This is a recurrent problem. The current episode started 1 to 4 weeks ago. The problem has been gradually improving. The cough is Productive of sputum. Associated symptoms include ear pain, nasal congestion and wheezing. Pertinent negatives include no chills, fever, postnasal drip or sore throat. Her past medical history is significant for bronchitis.  ?Otalgia  ?There is pain in both ears. This is a new problem. The current episode started 1 to 4 weeks ago. The problem occurs constantly. There has been no fever. The pain is moderate. Associated symptoms include coughing. Pertinent negatives include no sore throat. She has tried antibiotics for the symptoms.  ? ?Lab Results  ?Component Value Date  ? NA 139 03/20/2021  ? K 4.9 03/20/2021  ? CO2 23 03/20/2021  ? GLUCOSE 85 03/20/2021  ? BUN 16 03/20/2021  ? CREATININE 0.94 03/20/2021  ? CALCIUM 9.9 03/20/2021  ? EGFR 74 03/20/2021  ? GFRNONAA 75 03/11/2020  ? ?Lab Results  ?Component Value Date  ? CHOL 148 03/20/2021  ? HDL 38 (L) 03/20/2021  ? LDLCALC 84 03/20/2021  ? TRIG 150 (H) 03/20/2021  ? CHOLHDL 3.9 03/20/2021  ? ?Lab Results  ?Component Value Date  ? TSH 2.950 03/20/2021  ? ?Lab Results  ?Component Value Date  ? HGBA1C 5.4 03/20/2021  ? ?Lab Results  ?Component Value Date  ? WBC 8.0 03/20/2021  ? HGB 13.7 03/20/2021  ? HCT 40.7 03/20/2021  ? MCV 96 03/20/2021  ? PLT 289 03/20/2021  ? ?Lab Results  ?Component Value Date  ? ALT 16 03/20/2021  ? AST 18 03/20/2021  ? ALKPHOS 72 03/20/2021  ? BILITOT 0.3 03/20/2021  ? ?No results found for: 25OHVITD2, 25OHVITD3, VD25OH  ? ?Review of Systems  ?Constitutional:  Negative for chills, fatigue and fever.  ?HENT:  Positive for congestion and ear pain. Negative for postnasal drip, sinus pressure and sore throat.   ?Respiratory:  Positive for cough, chest tightness and wheezing.    ?Psychiatric/Behavioral:  Negative for dysphoric mood and sleep disturbance. The patient is not nervous/anxious.   ? ?Patient Active Problem List  ? Diagnosis Date Noted  ? Malignant neoplasm of upper-inner quadrant of right breast in female, estrogen receptor positive ([facility]) 10/01/2020  ? OAB (overactive bladder) 01/05/2018  ? Urge incontinence of urine 01/05/2018  ? Dyslipidemia 07/16/2014  ? Migraine without aura and responsive to treatment 07/16/2014  ? Chronic schizoaffective disorder ([facility]) 07/16/2014  ? ? ?No Known Allergies ? ?Past Surgical History:  ?Procedure Laterality Date  ? BASAL CELL CARCINOMA EXCISION    ? BREAST BIOPSY Right 09/30/2020  ? u/s bx-"ribbon" clip-path pending  ? MASTECTOMY, PARTIAL Right 10/2020  ? MOHS SURGERY  2012  ? ? ?Social History  ? ?Tobacco Use  ? Smoking status: Never  ? Smokeless tobacco: Never  ?Vaping Use  ? Vaping Use: Never used  ?Substance Use Topics  ? Alcohol use: Yes  ?  Alcohol/week: 0.0 standard drinks  ?  Comment: occasional  ? Drug use: No  ? ? ? ?Medication list has been reviewed and updated. ? ?Current Meds  ?Medication Sig  ? divalproex (DEPAKOTE ER) 500 MG 24 hr tablet Take 3 tablets (1,500 mg total) by mouth daily.  ? FIBER PO Take by mouth.  ? ipratropium (ATROVENT) 0.06 % nasal spray Place 2 sprays   into both nostrils 4 (four) times daily.  ? oxybutynin (DITROPAN-XL) 5 MG 24 hr tablet Take 1 tablet (5 mg total) by mouth daily.  ? Probiotic Product (PROBIOTIC PO) Take by mouth.  ? risperiDONE (RISPERDAL) 1 MG tablet TAKE 1 TABLET AT BEDTIME  ? tamoxifen (NOLVADEX) 20 MG tablet Take 20 mg by mouth daily.  ? ? ? ?  04/16/2021  ?  1:55 PM 04/09/2021  ? 11:14 AM 03/20/2021  ?  9:06 AM 10/29/2020  ?  1:26 PM  ?GAD 7 : Generalized Anxiety Score  ?Nervous, Anxious, on Edge 0 0 0 1  ?Control/stop worrying 0 0 0 1  ?Worry too much - different things 0 0 0 1  ?Trouble relaxing 0 0 0 1  ?Restless 0 0 0 0  ?Easily annoyed or irritable 0 0 0 1  ?Afraid - awful might happen 0 0  0 1  ?Total GAD 7 Score 0 0 0 6  ?Anxiety Difficulty Not difficult at all Not difficult at all Not difficult at all Not difficult at all  ? ? ? ?  04/16/2021  ?  1:55 PM  ?Depression screen PHQ 2/9  ?Decreased Interest 0  ?Down, Depressed, Hopeless 0  ?PHQ - 2 Score 0  ?Altered sleeping 0  ?Tired, decreased energy 0  ?Change in appetite 0  ?Feeling bad or failure about yourself  0  ?Trouble concentrating 0  ?Moving slowly or fidgety/restless 0  ?Suicidal thoughts 0  ?PHQ-9 Score 0  ?Difficult doing work/chores Not difficult at all  ? ? ?BP Readings from Last 3 Encounters:  ?04/16/21 104/76  ?04/09/21 118/60  ?03/20/21 124/72  ? ? ?Physical Exam ?Vitals and nursing note reviewed.  ?Constitutional:   ?   General: She is not in acute distress. ?   Appearance: Normal appearance. She is well-developed.  ?HENT:  ?   Head: Normocephalic and atraumatic.  ?   Right Ear: No middle ear effusion. Tympanic membrane is retracted. Tympanic membrane is not erythematous.  ?   Left Ear: A middle ear effusion is present. Tympanic membrane is not erythematous or retracted.  ?Cardiovascular:  ?   Rate and Rhythm: Normal rate and regular rhythm.  ?   Pulses: Normal pulses.  ?Pulmonary:  ?   Effort: Pulmonary effort is normal. No respiratory distress.  ?   Breath sounds: No wheezing or rhonchi.  ?Musculoskeletal:  ?   Cervical back: Normal range of motion.  ?Lymphadenopathy:  ?   Cervical: No cervical adenopathy.  ?Skin: ?   General: Skin is warm and dry.  ?   Findings: No rash.  ?Neurological:  ?   Mental Status: She is alert and oriented to person, place, and time.  ?Psychiatric:     ?   Mood and Affect: Mood normal.     ?   Behavior: Behavior normal.  ? ? ?Wt Readings from Last 3 Encounters:  ?04/16/21 205 lb 12.8 oz (93.4 kg)  ?04/09/21 204 lb (92.5 kg)  ?03/20/21 207 lb (93.9 kg)  ? ? ?BP 104/76   Pulse 83   Temp 97.9 ?F (36.6 ?C) (Oral)   Ht 5' 9" (1.753 m)   Wt 205 lb 12.8 oz (93.4 kg)   LMP  (LMP Unknown)   SpO2 98%   BMI  30.39 kg/m?  ? ?Assessment and Plan: ?1. Bilateral otitis media, unspecified otitis media type ?Did not respond to Augmentin or Zpak ?Will add steroid taper, Cefdinir ?Continue Mucinex D, fluids ?- predniSONE (DELTASONE)  10 MG tablet; Take 1 tablet (10 mg total) by mouth as directed for 6 days. Take 6,5,4,3,2,1 then stop  Dispense: 21 tablet; Refill: 0 ?- cefdinir (OMNICEF) 300 MG capsule; Take 1 capsule (300 mg total) by mouth 2 (two) times daily for 10 days.  Dispense: 20 capsule; Refill: 0 ? ?2. Acute cough ?Use albuterol MDI PRN ?- albuterol (VENTOLIN HFA) 108 (90 Base) MCG/ACT inhaler; Inhale 2 puffs into the lungs every 6 (six) hours as needed for wheezing or shortness of breath.  Dispense: 1 each; Refill: 0 ? ? ?Partially dictated using Dragon software. Any errors are unintentional. ? ? , MD ?[facility] ?[facility] ? ?04/16/2021 ? ? ? ? ? ?

## 2021-04-17 NOTE — Telephone Encounter (Signed)
Requested medication (s) are due for refill today:   Prescribed 04/16/2021 by Dr. Army Melia ? ?Requested medication (s) are on the active medication list:   Yes ? ?Future visit scheduled:   Yes in a yr. ? ? ?Last ordered: Prescribed on 04/16/2021 1 each, 0 refills ? ?Returned because a 90 day supply is being requested.     ? ?Requested Prescriptions  ?Pending Prescriptions Disp Refills  ? albuterol (VENTOLIN HFA) 108 (90 Base) MCG/ACT inhaler [Pharmacy Med Name: ALBUTEROL HFA INH (200 PUFFS) 6.7GM] 20.1 g   ?  Sig: INHALE 2 PUFFS INTO THE LUNGS EVERY 6 HOURS AS NEEDED FOR WHEEZING OR SHORTNESS OF BREATH  ?  ? Pulmonology:  Beta Agonists 2 Passed - 04/16/2021  2:28 PM  ?  ?  Passed - Last BP in normal range  ?  BP Readings from Last 1 Encounters:  ?04/16/21 104/76  ?  ?  ?  ?  Passed - Last Heart Rate in normal range  ?  Pulse Readings from Last 1 Encounters:  ?04/16/21 83  ?  ?  ?  ?  Passed - Valid encounter within last 12 months  ?  Recent Outpatient Visits   ? ?      ? Yesterday Bilateral otitis media, unspecified otitis media type  ? [facility] [name], MD  ? 1 week ago Bronchitis with bronchospasm  ? [facility] [name], MD  ? 4 weeks ago Annual physical exam  ? [facility] [name], MD  ? 5 months ago Acute cystitis without hematuria  ? [facility] [name], MD  ? 9 months ago Irritant contact dermatitis due to plants, except food  ? [facility] [name], MD  ? ?  ?  ?Future Appointments   ? ?        ? In 11 months [name], MD [facility] [place]  ? ?  ? ?  ?  ?  ? ?

## 2021-05-08 DIAGNOSIS — F419 Anxiety disorder, unspecified: Secondary | ICD-10-CM | POA: Diagnosis not present

## 2021-05-08 DIAGNOSIS — Z9011 Acquired absence of right breast and nipple: Secondary | ICD-10-CM | POA: Diagnosis not present

## 2021-05-08 DIAGNOSIS — E785 Hyperlipidemia, unspecified: Secondary | ICD-10-CM | POA: Diagnosis not present

## 2021-05-08 DIAGNOSIS — Z6830 Body mass index (BMI) 30.0-30.9, adult: Secondary | ICD-10-CM | POA: Diagnosis not present

## 2021-05-08 DIAGNOSIS — Z170 Estrogen receptor positive status [ER+]: Secondary | ICD-10-CM | POA: Diagnosis not present

## 2021-05-08 DIAGNOSIS — Z923 Personal history of irradiation: Secondary | ICD-10-CM | POA: Diagnosis not present

## 2021-05-08 DIAGNOSIS — C50911 Malignant neoplasm of unspecified site of right female breast: Secondary | ICD-10-CM | POA: Diagnosis not present

## 2021-05-08 DIAGNOSIS — F259 Schizoaffective disorder, unspecified: Secondary | ICD-10-CM | POA: Diagnosis not present

## 2021-05-19 ENCOUNTER — Other Ambulatory Visit: Payer: Self-pay | Admitting: Internal Medicine

## 2021-05-19 DIAGNOSIS — F259 Schizoaffective disorder, unspecified: Secondary | ICD-10-CM

## 2021-05-22 DIAGNOSIS — Z808 Family history of malignant neoplasm of other organs or systems: Secondary | ICD-10-CM | POA: Diagnosis not present

## 2021-05-22 DIAGNOSIS — Z170 Estrogen receptor positive status [ER+]: Secondary | ICD-10-CM | POA: Diagnosis not present

## 2021-05-22 DIAGNOSIS — F259 Schizoaffective disorder, unspecified: Secondary | ICD-10-CM | POA: Diagnosis not present

## 2021-05-22 DIAGNOSIS — C50211 Malignant neoplasm of upper-inner quadrant of right female breast: Secondary | ICD-10-CM | POA: Diagnosis not present

## 2021-05-22 DIAGNOSIS — Z8041 Family history of malignant neoplasm of ovary: Secondary | ICD-10-CM | POA: Diagnosis not present

## 2021-05-22 DIAGNOSIS — R922 Inconclusive mammogram: Secondary | ICD-10-CM | POA: Diagnosis not present

## 2021-05-22 DIAGNOSIS — Z79810 Long term (current) use of selective estrogen receptor modulators (SERMs): Secondary | ICD-10-CM | POA: Diagnosis not present

## 2021-05-22 DIAGNOSIS — Z6830 Body mass index (BMI) 30.0-30.9, adult: Secondary | ICD-10-CM | POA: Diagnosis not present

## 2021-05-22 DIAGNOSIS — G43909 Migraine, unspecified, not intractable, without status migrainosus: Secondary | ICD-10-CM | POA: Diagnosis not present

## 2021-05-22 LAB — HM MAMMOGRAPHY

## 2021-06-18 ENCOUNTER — Other Ambulatory Visit: Payer: Self-pay

## 2021-06-18 ENCOUNTER — Ambulatory Visit (INDEPENDENT_AMBULATORY_CARE_PROVIDER_SITE_OTHER): Payer: BC Managed Care – PPO | Admitting: Gastroenterology

## 2021-06-18 ENCOUNTER — Encounter: Payer: Self-pay | Admitting: Gastroenterology

## 2021-06-18 VITALS — BP 133/85 | HR 76 | Temp 98.0°F | Ht 69.0 in | Wt 210.0 lb

## 2021-06-18 DIAGNOSIS — Z1211 Encounter for screening for malignant neoplasm of colon: Secondary | ICD-10-CM

## 2021-06-18 DIAGNOSIS — K5904 Chronic idiopathic constipation: Secondary | ICD-10-CM

## 2021-06-18 MED ORDER — NA SULFATE-K SULFATE-MG SULF 17.5-3.13-1.6 GM/177ML PO SOLN: 354.0000 mL | Freq: Once | ORAL | 0 refills | Status: AC

## 2021-06-18 NOTE — Progress Notes (Signed)
[name], MD [address]  [place]  [place], [place] [address]  Main: [phone]  Fax: [phone]    Gastroenterology Consultation  Referring Provider:     [name], MD Primary Care Physician:  [name], MD Primary Gastroenterologist:  Dr. [name] Reason for Consultation:     Chronic constipation        HPI:   Beth Lewis is a 51 y.o. female referred by Dr. Army Melia, Jesse Sans, MD  for consultation & management of chronic constipation.  Patient reports irregular bowel habits, has hard bowel movements every 1 to 3 days and symptoms associated with urgency and soft stools after initial hard BM.  Patient admits to not drinking enough water, generally drinks coffee with creamer and fruit juices and her diet does not contain much fiber as well.  She denies any other symptoms.  No evidence of anemia, TSH normal.  She denies any rectal bleeding.  She is also due for colon cancer screening Patient is a breast cancer survivor Patient does not smoke or drink alcohol She denies family history of GI malignancy  NSAIDs: None  Antiplts/Anticoagulants/Anti thrombotics: None  GI Procedures: None  Past Medical History:  Diagnosis Date   Basal cell carcinoma 2011,2014   forehead   Cancer ([place])    skin ca   Knee strain, right, initial encounter 02/20/2016   Migraine    usually with her menses   Schizoaffective disorder ([place])     Past Surgical History:  Procedure Laterality Date   BASAL CELL CARCINOMA EXCISION     BREAST BIOPSY Right 09/30/2020   u/s bx-"ribbon" clip-path pending   MASTECTOMY, PARTIAL Right 10/2020   MOHS SURGERY  2012   Current Outpatient Medications:    divalproex (DEPAKOTE) 500 MG DR tablet, Take by mouth., Disp: , Rfl:    Na Sulfate-K Sulfate-Mg Sulf 17.5-3.13-1.6 GM/177ML SOLN, Take 354 mLs by mouth once for 1 dose., Disp: 708 mL, Rfl: 0   oxybutynin (DITROPAN-XL) 5 MG 24 hr tablet, Take 1 tablet by mouth daily.,  Disp: , Rfl:    Probiotic Product (PROBIOTIC PO), Take by mouth., Disp: , Rfl:    risperiDONE (RISPERDAL) 1 MG tablet, TAKE 1 TABLET AT BEDTIME, Disp: 90 tablet, Rfl: 3   tamoxifen (NOLVADEX) 20 MG tablet, Take 20 mg by mouth daily., Disp: , Rfl:    zinc gluconate 50 MG tablet, Take 1 tablet by mouth daily., Disp: , Rfl:    Family History  Problem Relation Age of Onset   Cancer Father        brain   Breast cancer Cousin 25   Colon cancer Maternal Grandmother 97   Ovarian cancer Maternal Grandmother 97       mets to ovaries, not primary site     Social History   Tobacco Use   Smoking status: Never   Smokeless tobacco: Never  Vaping Use   Vaping Use: Never used  Substance Use Topics   Alcohol use: Yes    Alcohol/week: 0.0 standard drinks    Comment: occasional   Drug use: No    Allergies as of 06/18/2021   (No Known Allergies)    Review of Systems:    All systems reviewed and negative except where noted in HPI.   Physical Exam:  BP 133/85 (BP Location: Left Arm, Patient Position: Sitting, Cuff Size: Normal)   Pulse 76   Temp 98 F (36.7 C) (Oral)   Ht 5'  9" (1.753 m)   Wt 210 lb (95.3 kg)   BMI 31.01 kg/m  No LMP recorded. (Menstrual status: Oral contraceptives).  General:   Alert,  Well-developed, well-nourished, pleasant and cooperative in NAD Head:  Normocephalic and atraumatic. Eyes:  Sclera clear, no icterus.   Conjunctiva pink. Ears:  Normal auditory acuity. Nose:  No deformity, discharge, or lesions. Mouth:  No deformity or lesions,oropharynx pink & moist. Neck:  Supple; no masses or thyromegaly. Lungs:  Respirations even and unlabored.  Clear throughout to auscultation.   No wheezes, crackles, or rhonchi. No acute distress. Heart:  Regular rate and rhythm; no murmurs, clicks, rubs, or gallops. Abdomen:  Normal bowel sounds. Soft, non-tender and non-distended without masses, hepatosplenomegaly or hernias noted.  No guarding or rebound tenderness.    Rectal: Not performed Msk:  Symmetrical without gross deformities. Good, equal movement & strength bilaterally. Pulses:  Normal pulses noted. Extremities:  No clubbing or edema.  No cyanosis. Neurologic:  Alert and oriented x3;  grossly normal neurologically. Skin:  Intact without significant lesions or rashes. No jaundice. Psych:  Alert and cooperative. Normal mood and affect.  Imaging Studies: No abdominal imaging  Assessment and Plan:   [name] is a 51 y.o. pleasant Caucasian female with chronic idiopathic constipation  Chronic idiopathic constipation TSH normal Her diet is devoid of fiber Encouraged high-fiber diet, information provided Discussed about adequate intake of water, cut back on artificial sweeteners, creamer and fruit juices  Colon cancer screening Discussed about colonoscopy with 2-day prep and patient is agreeable  I have discussed alternative options, risks & benefits,  which include, but are not limited to, bleeding, infection, perforation,respiratory complication & drug reaction.  The patient agrees with this plan & written consent will be obtained.      Follow up as needed   [name], MD

## 2021-06-19 ENCOUNTER — Telehealth: Payer: Self-pay

## 2021-06-19 NOTE — Telephone Encounter (Signed)
Patient contacted office to request to reschedule her colonoscopy to a Friday late August.  She has been moved to Friday 09/26/21 at [facility] with Dr. Marius Ditch.  Thanks, [place], [place]

## 2021-09-09 DIAGNOSIS — Z6830 Body mass index (BMI) 30.0-30.9, adult: Secondary | ICD-10-CM | POA: Diagnosis not present

## 2021-09-09 DIAGNOSIS — Z170 Estrogen receptor positive status [ER+]: Secondary | ICD-10-CM | POA: Diagnosis not present

## 2021-09-09 DIAGNOSIS — Z9010 Acquired absence of unspecified breast and nipple: Secondary | ICD-10-CM | POA: Diagnosis not present

## 2021-09-09 DIAGNOSIS — C50811 Malignant neoplasm of overlapping sites of right female breast: Secondary | ICD-10-CM | POA: Diagnosis not present

## 2021-09-09 DIAGNOSIS — C50211 Malignant neoplasm of upper-inner quadrant of right female breast: Secondary | ICD-10-CM | POA: Diagnosis not present

## 2021-09-15 ENCOUNTER — Other Ambulatory Visit: Payer: Self-pay

## 2021-09-15 MED ORDER — NA SULFATE-K SULFATE-MG SULF 17.5-3.13-1.6 GM/177ML PO SOLN: 1.0000 | Freq: Once | ORAL | 0 refills | Status: AC

## 2021-09-26 ENCOUNTER — Encounter: Payer: Self-pay | Admitting: Gastroenterology

## 2021-09-26 ENCOUNTER — Ambulatory Visit: Payer: BC Managed Care – PPO | Admitting: Certified Registered"

## 2021-09-26 ENCOUNTER — Ambulatory Visit
Admission: RE | Admit: 2021-09-26 | Discharge: 2021-09-26 | Disposition: A | Discharge status: Home / Self Care | Payer: BC Managed Care – PPO | Attending: Gastroenterology | Admitting: Gastroenterology

## 2021-09-26 ENCOUNTER — Encounter
Admission: RE | Disposition: A | Discharge status: Home / Self Care | Payer: Self-pay | Source: Home / Self Care | Attending: Gastroenterology

## 2021-09-26 DIAGNOSIS — Z85828 Personal history of other malignant neoplasm of skin: Secondary | ICD-10-CM | POA: Insufficient documentation

## 2021-09-26 DIAGNOSIS — Z1211 Encounter for screening for malignant neoplasm of colon: Secondary | ICD-10-CM

## 2021-09-26 DIAGNOSIS — F259 Schizoaffective disorder, unspecified: Secondary | ICD-10-CM | POA: Diagnosis not present

## 2021-09-26 HISTORY — PX: COLONOSCOPY WITH PROPOFOL: SHX5780

## 2021-09-26 LAB — POCT PREGNANCY, URINE: Preg Test, Ur: NEGATIVE

## 2021-09-26 SURGERY — COLONOSCOPY WITH PROPOFOL
Anesthesia: General

## 2021-09-26 MED ORDER — PROPOFOL 10 MG/ML IV BOLUS
INTRAVENOUS | Status: DC | PRN
  Administered 2021-09-26: 80 mg via INTRAVENOUS

## 2021-09-26 MED ORDER — SODIUM CHLORIDE 0.9 % IV SOLN: INTRAVENOUS | Status: DC

## 2021-09-26 MED ORDER — LIDOCAINE HCL (CARDIAC) PF 100 MG/5ML IV SOSY
PREFILLED_SYRINGE | INTRAVENOUS | Status: DC | PRN
  Administered 2021-09-26: 50 mg via INTRAVENOUS

## 2021-09-26 MED ORDER — PROPOFOL 500 MG/50ML IV EMUL
INTRAVENOUS | Status: DC | PRN
  Administered 2021-09-26: 140 ug/kg/min via INTRAVENOUS

## 2021-09-26 MED ORDER — PROPOFOL 1000 MG/100ML IV EMUL
INTRAVENOUS | Status: AC
  Filled 2021-09-26: qty 100

## 2021-09-26 NOTE — Anesthesia Postprocedure Evaluation (Signed)
Anesthesia Post Note  Patient: Beth Lewis  Procedure(s) Performed: COLONOSCOPY WITH PROPOFOL  Patient location during evaluation: Endoscopy Anesthesia Type: General Level of consciousness: awake and alert Pain management: pain level controlled Vital Signs Assessment: post-procedure vital signs reviewed and stable Respiratory status: spontaneous breathing, nonlabored ventilation, respiratory function stable and patient connected to nasal cannula oxygen Cardiovascular status: blood pressure returned to baseline and stable Postop Assessment: no apparent nausea or vomiting Anesthetic complications: no   No notable events documented.   Last Vitals:  Vitals:   09/26/21 0846 09/26/21 0850  BP: 105/66 102/63  Pulse: 68 68  Resp: 15 17  Temp:    SpO2: 100%     Last Pain:  Vitals:   09/26/21 0834  TempSrc: Temporal  PainSc:                  Precious Haws Mekesha Solomon

## 2021-09-26 NOTE — Op Note (Signed)
[facility] Gastroenterology Patient Name: Beth Lewis Procedure Date: 09/26/2021 8:05 AM MRN: [id number] Account #: [id number] Date of Birth: Aug 20, 1970 Admit Type: Outpatient Age: 51 Room: [facility] ENDO ROOM 2 Gender: Female Note Status: Finalized Instrument Name: Jasper Riling [id number] Procedure:             Colonoscopy Indications:           Screening for colorectal malignant neoplasm, This is                         the patient's first colonoscopy Providers:             Lin Landsman MD, MD Medicines:             General Anesthesia Complications:         No immediate complications. Estimated blood loss: None. Procedure:             Pre-Anesthesia Assessment:                        - Prior to the procedure, a History and Physical was                         performed, and patient medications and allergies were                         reviewed. The patient is competent. The risks and                         benefits of the procedure and the sedation options and                         risks were discussed with the patient. All questions                         were answered and informed consent was obtained.                         Patient identification and proposed procedure were                         verified by the physician, the nurse, the                         anesthesiologist, the anesthetist and the technician                         in the pre-procedure area in the procedure room in the                         endoscopy suite. Mental Status Examination: alert and                         oriented. Airway Examination: normal oropharyngeal                         airway and neck mobility. Respiratory Examination:                         clear to auscultation.  CV Examination: normal.                         Prophylactic Antibiotics: The patient does not require                         prophylactic antibiotics. Prior Anticoagulants: The                          patient has taken no previous anticoagulant or                         antiplatelet agents. ASA Grade Assessment: II - A                         patient with mild systemic disease. After reviewing                         the risks and benefits, the patient was deemed in                         satisfactory condition to undergo the procedure. The                         anesthesia plan was to use general anesthesia.                         Immediately prior to administration of medications,                         the patient was re-assessed for adequacy to receive                         sedatives. The heart rate, respiratory rate, oxygen                         saturations, blood pressure, adequacy of pulmonary                         ventilation, and response to care were monitored                         throughout the procedure. The physical status of the                         patient was re-assessed after the procedure.                        After obtaining informed consent, the colonoscope was                         passed under direct vision. Throughout the procedure,                         the patient's blood pressure, pulse, and oxygen                         saturations were monitored continuously. The  Colonoscope was introduced through the anus and                         advanced to the the terminal ileum, with                         identification of the appendiceal orifice and IC                         valve. The colonoscopy was performed without                         difficulty. The patient tolerated the procedure well.                         The quality of the bowel preparation was evaluated                         using the BBPS [facility] Bowel Preparation Scale) with                         scores of: Right Colon = 3, Transverse Colon = 3 and                         Left Colon = 3 (entire mucosa seen well with no                          residual staining, small fragments of stool or opaque                         liquid). The total BBPS score equals 9. Findings:      The perianal and digital rectal examinations were normal. Pertinent       negatives include normal sphincter tone and no palpable rectal lesions.      The entire examined colon appeared normal.      The retroflexed view of the distal rectum and anal verge was normal and       showed no anal or rectal abnormalities. Impression:            - The entire examined colon is normal.                        - The distal rectum and anal verge are normal on                         retroflexion view.                        - No specimens collected. Recommendation:        - Discharge patient to home (with escort).                        - Resume previous diet today.                        - Continue present medications.                        - Repeat colonoscopy  in 10 years for screening                         purposes. Procedure Code(s):     --- Professional ---                        F8403, Colorectal cancer screening; colonoscopy on                         individual not meeting criteria for high risk Diagnosis Code(s):     --- Professional ---                        Z12.11, Encounter for screening for malignant neoplasm                         of colon CPT copyright 2019 American Medical Association. All rights reserved. The codes documented in this report are preliminary and upon coder review may  be revised to meet current compliance requirements. Dr. Ulyess Mort Lin Landsman MD, MD 09/26/2021 8:36:12 AM This report has been signed electronically. Number of Addenda: 0 Note Initiated On: 09/26/2021 8:05 AM Scope Withdrawal Time: 0 hours 8 minutes 7 seconds  Total Procedure Duration: 0 hours 13 minutes 41 seconds  Estimated Blood Loss:  Estimated blood loss: none.      [facility]

## 2021-09-26 NOTE — Anesthesia Preprocedure Evaluation (Signed)
Anesthesia Evaluation  Patient identified by MRN, date of birth, ID band Patient awake    Reviewed: Allergy & Precautions, NPO status , Patient's Chart, lab work & pertinent test results  History of Anesthesia Complications Negative for: history of anesthetic complications  Airway Mallampati: III  TM Distance: <3 FB Neck ROM: full    Dental  (+) Chipped   Pulmonary neg pulmonary ROS, neg shortness of breath,    Pulmonary exam normal        Cardiovascular Exercise Tolerance: Good (-) anginanegative cardio ROS Normal cardiovascular exam     Neuro/Psych  Headaches, negative psych ROS   GI/Hepatic negative GI ROS, Neg liver ROS, neg GERD  ,  Endo/Other  negative endocrine ROS  Renal/GU negative Renal ROS  negative genitourinary   Musculoskeletal   Abdominal   Peds  Hematology negative hematology ROS (+)   Anesthesia Other Findings Past Medical History: 2011,2014: Basal cell carcinoma     Comment:  forehead No date: Cancer [facility])     Comment:  skin ca 02/20/2016: Knee strain, right, initial encounter No date: Migraine     Comment:  usually with her menses No date: Schizoaffective disorder [facility])  Past Surgical History: No date: BASAL CELL CARCINOMA EXCISION 09/30/2020: BREAST BIOPSY; Right     Comment:  u/s bx-"ribbon" clip-path pending 10/2020: MASTECTOMY, PARTIAL; Right 2012: MOHS SURGERY  BMI    Body Mass Index: 29.53 kg/m      Reproductive/Obstetrics negative OB ROS                             Anesthesia Physical Anesthesia Plan  ASA: 2  Anesthesia Plan: General   Post-op Pain Management:    Induction: Intravenous  PONV Risk Score and Plan: Propofol infusion and TIVA  Airway Management Planned: Natural Airway and Nasal Cannula  Additional Equipment:   Intra-op Plan:   Post-operative Plan:   Informed Consent: I have reviewed the patients History and Physical,  chart, labs and discussed the procedure including the risks, benefits and alternatives for the proposed anesthesia with the patient or authorized representative who has indicated his/her understanding and acceptance.     Dental Advisory Given  Plan Discussed with: Anesthesiologist, CRNA and Surgeon  Anesthesia Plan Comments: (Patient consented for risks of anesthesia including but not limited to:  - adverse reactions to medications - risk of airway placement if required - damage to eyes, teeth, lips or other oral mucosa - nerve damage due to positioning  - sore throat or hoarseness - Damage to heart, brain, nerves, lungs, other parts of body or loss of life  Patient voiced understanding.)        Anesthesia Quick Evaluation

## 2021-09-26 NOTE — H&P (Signed)
[name], MD [address]  [place]  [place], [place] [address]  Main: [phone]  Fax: [phone] Pager: [phone]  Primary Care Physician:  [name], MD Primary Gastroenterologist:  Dr. [name]  Pre-Procedure History & Physical: HPI:  Beth Lewis is a 51 y.o. female is here for an colonoscopy.   Past Medical History:  Diagnosis Date   Basal cell carcinoma 2011,2014   forehead   Cancer ([place])    skin ca   Knee strain, right, initial encounter 02/20/2016   Migraine    usually with her menses   Schizoaffective disorder [facility])     Past Surgical History:  Procedure Laterality Date   BASAL CELL CARCINOMA EXCISION     BREAST BIOPSY Right 09/30/2020   u/s bx-"ribbon" clip-path pending   MASTECTOMY, PARTIAL Right 10/2020   MOHS SURGERY  2012    Prior to Admission medications   Medication Sig Start Date End Date Taking? Authorizing Provider  divalproex (DEPAKOTE) 500 MG DR tablet Take by mouth.   Yes [provider]  oxybutynin (DITROPAN-XL) 5 MG 24 hr tablet Take 1 tablet by mouth daily.   Yes [provider]  tamoxifen (NOLVADEX) 20 MG tablet Take 20 mg by mouth daily. 01/24/21  Yes [provider]  Probiotic Product (PROBIOTIC PO) Take by mouth.    [provider]  risperiDONE (RISPERDAL) 1 MG tablet TAKE 1 TABLET AT BEDTIME 05/19/21   [name], MD  zinc gluconate 50 MG tablet Take 1 tablet by mouth daily.    [provider]    Allergies as of 06/18/2021   (No Known Allergies)    Family History  Problem Relation Age of Onset   Cancer Father        brain   Breast cancer Cousin 25   Colon cancer Maternal Grandmother 97   Ovarian cancer Maternal Grandmother 97       mets to ovaries, not primary site    Social History   Socioeconomic History   Marital status: Married    Spouse name: Not on file   Number of children: Not on file   Years of education: Not on file   Highest  education level: Not on file  Occupational History   Not on file  Tobacco Use   Smoking status: Never   Smokeless tobacco: Never  Vaping Use   Vaping Use: Never used  Substance and Sexual Activity   Alcohol use: Yes    Alcohol/week: 0.0 standard drinks of alcohol    Comment: occasional   Drug use: No   Sexual activity: Yes    Birth control/protection: Pill  Other Topics Concern   Not on file  Social History Narrative   Not on file   Social Determinants of Health   Financial Resource Strain: Not on file  Food Insecurity: Not on file  Transportation Needs: Not on file  Physical Activity: Sufficiently Active (01/04/2017)   Exercise Vital Sign    Days of Exercise per Week: 3 days    Minutes of Exercise per Session: 60 min  Stress: No Stress Concern Present (01/04/2017)   [place] of Stress : Not at all  Social Connections: [place] (01/04/2017)   Social Connection and Isolation Panel [NHANES]    Frequency of Communication with Friends and Family: More than three times a week    Frequency of Social Gatherings with Friends and  Family: Twice a week    Attends Religious Services: More than 4 times per year    Active Member of Clubs or Organizations: Yes    Attends Archivist Meetings: More than 4 times per year    Marital Status: Married  Human resources officer Violence: Not At Risk (01/04/2017)   Humiliation, Afraid, Rape, and Kick questionnaire    Fear of Current or Ex-Partner: No    Emotionally Abused: No    Physically Abused: No    Sexually Abused: No    Review of Systems: See HPI, otherwise negative ROS  Physical Exam: BP 132/78   Pulse 84   Temp (!) 96.4 F (35.8 C) (Temporal)   Resp 17   Ht '5\' 9"'$  (1.753 m)   Wt 90.7 kg   SpO2 100%   BMI 29.53 kg/m  General:   Alert,  pleasant and cooperative in NAD Head:  Normocephalic and atraumatic. Neck:  Supple; no masses or  thyromegaly. Lungs:  Clear throughout to auscultation.    Heart:  Regular rate and rhythm. Abdomen:  Soft, nontender and nondistended. Normal bowel sounds, without guarding, and without rebound.   Neurologic:  Alert and  oriented x4;  grossly normal neurologically.  Impression/Plan: [name] is here for an colonoscopy to be performed for colon cancer screening  Risks, benefits, limitations, and alternatives regarding  colonoscopy have been reviewed with the patient.  Questions have been answered.  All parties agreeable.   [name], MD  09/26/2021, 7:50 AM

## 2021-09-26 NOTE — Transfer of Care (Signed)
Immediate Anesthesia Transfer of Care Note  Patient: Beth Lewis  Procedure(s) Performed: COLONOSCOPY WITH PROPOFOL  Patient Location: PACU and Endoscopy Unit  Anesthesia Type:General  Level of Consciousness: drowsy  Airway & Oxygen Therapy: Patient Spontanous Breathing  Post-op Assessment: Report given to RN  Post vital signs: stable  Last Vitals:  Vitals Value Taken Time  BP 87/45 09/26/21 0835  Temp    Pulse 74 09/26/21 0835  Resp 20 09/26/21 0835  SpO2 100 % 09/26/21 0835  Vitals shown include unvalidated device data.  Last Pain:  Vitals:   09/26/21 0719  TempSrc: Temporal  PainSc: 0-No pain         Complications: No notable events documented.

## 2021-11-21 DIAGNOSIS — C50911 Malignant neoplasm of unspecified site of right female breast: Secondary | ICD-10-CM | POA: Diagnosis not present

## 2021-11-21 DIAGNOSIS — E785 Hyperlipidemia, unspecified: Secondary | ICD-10-CM | POA: Diagnosis not present

## 2021-12-23 DIAGNOSIS — S3769XA Other injury of uterus, initial encounter: Secondary | ICD-10-CM | POA: Diagnosis not present

## 2021-12-23 DIAGNOSIS — R102 Pelvic and perineal pain: Secondary | ICD-10-CM | POA: Diagnosis not present

## 2021-12-23 DIAGNOSIS — X58XXXA Exposure to other specified factors, initial encounter: Secondary | ICD-10-CM | POA: Diagnosis not present

## 2021-12-23 DIAGNOSIS — Z79810 Long term (current) use of selective estrogen receptor modulators (SERMs): Secondary | ICD-10-CM | POA: Diagnosis not present

## 2021-12-23 DIAGNOSIS — C50919 Malignant neoplasm of unspecified site of unspecified female breast: Secondary | ICD-10-CM | POA: Diagnosis not present

## 2022-02-11 DIAGNOSIS — D2262 Melanocytic nevi of left upper limb, including shoulder: Secondary | ICD-10-CM | POA: Diagnosis not present

## 2022-02-11 DIAGNOSIS — C4441 Basal cell carcinoma of skin of scalp and neck: Secondary | ICD-10-CM | POA: Diagnosis not present

## 2022-02-11 DIAGNOSIS — D2261 Melanocytic nevi of right upper limb, including shoulder: Secondary | ICD-10-CM | POA: Diagnosis not present

## 2022-02-11 DIAGNOSIS — D2272 Melanocytic nevi of left lower limb, including hip: Secondary | ICD-10-CM | POA: Diagnosis not present

## 2022-02-11 DIAGNOSIS — D225 Melanocytic nevi of trunk: Secondary | ICD-10-CM | POA: Diagnosis not present

## 2022-02-11 DIAGNOSIS — D485 Neoplasm of uncertain behavior of skin: Secondary | ICD-10-CM | POA: Diagnosis not present

## 2022-02-19 IMAGING — MG MM BREAST LOCALIZATION CLIP
4 series · 4 of 12 positions shown · non-contrast
Comparison: Previous exam(s).

CLINICAL DATA: Status post ultrasound-guided core biopsy of mass in
the RIGHT breast.

EXAM:
3D DIAGNOSTIC RIGHT MAMMOGRAM POST ULTRASOUND BIOPSY

[R CC synth-2D]
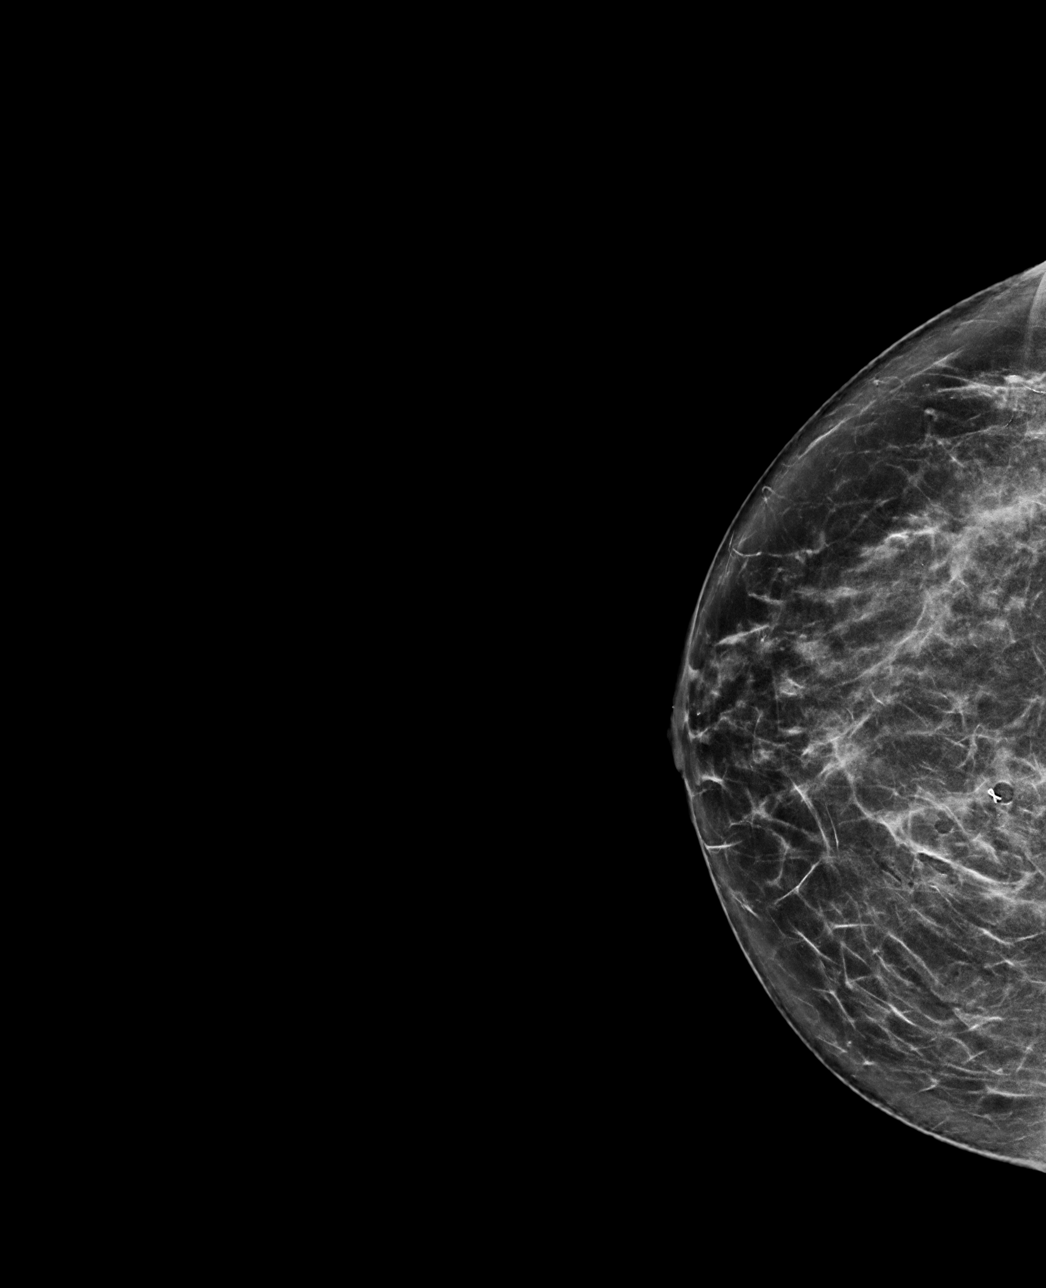

[R ML synth-2D]
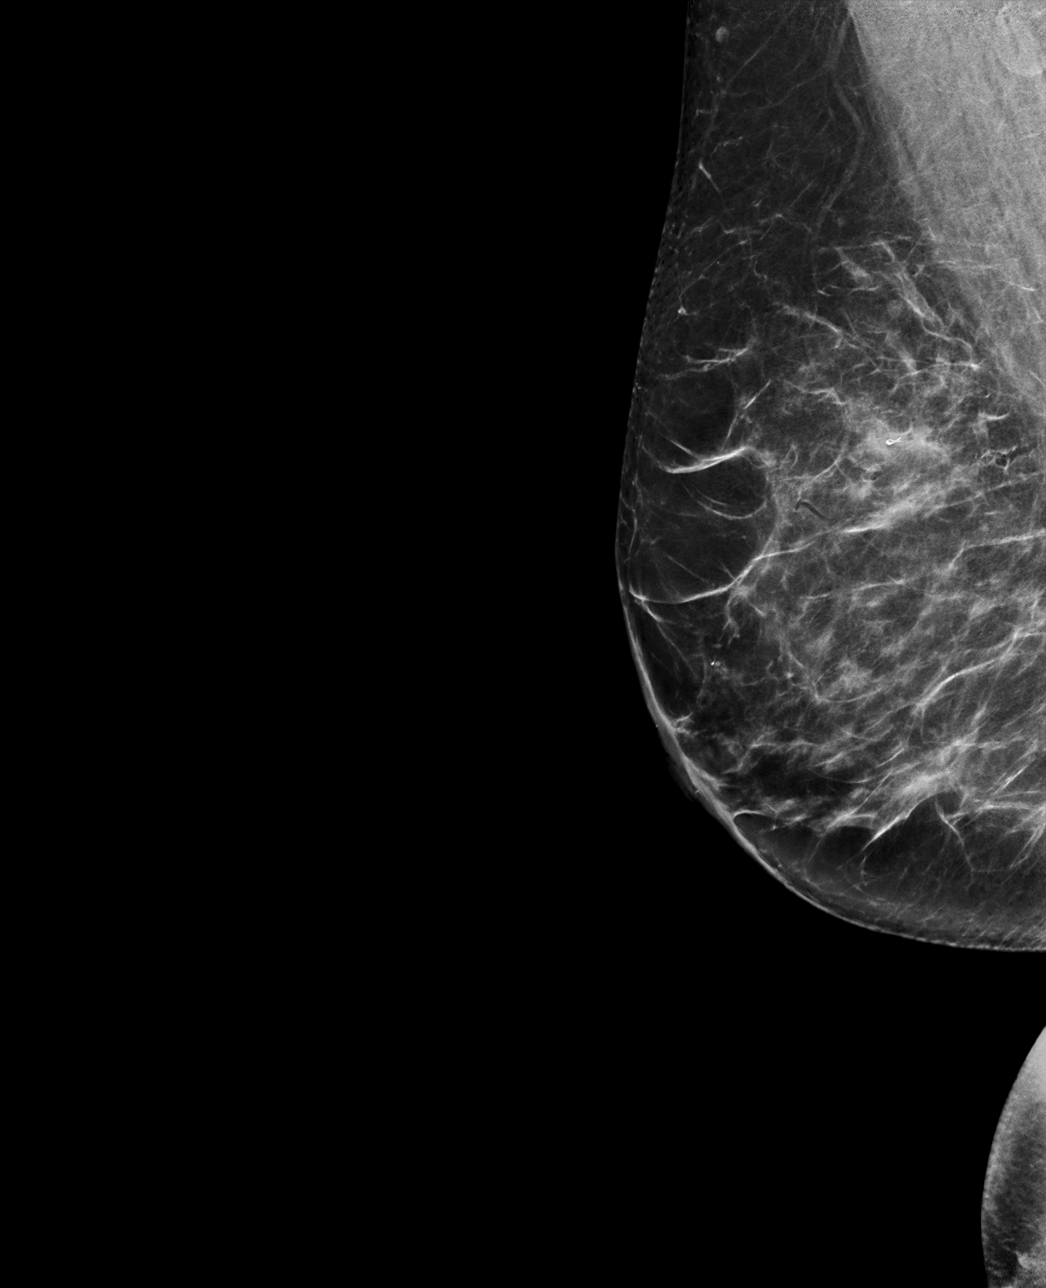

[R ML tomo · tomo slice 43/85.0]
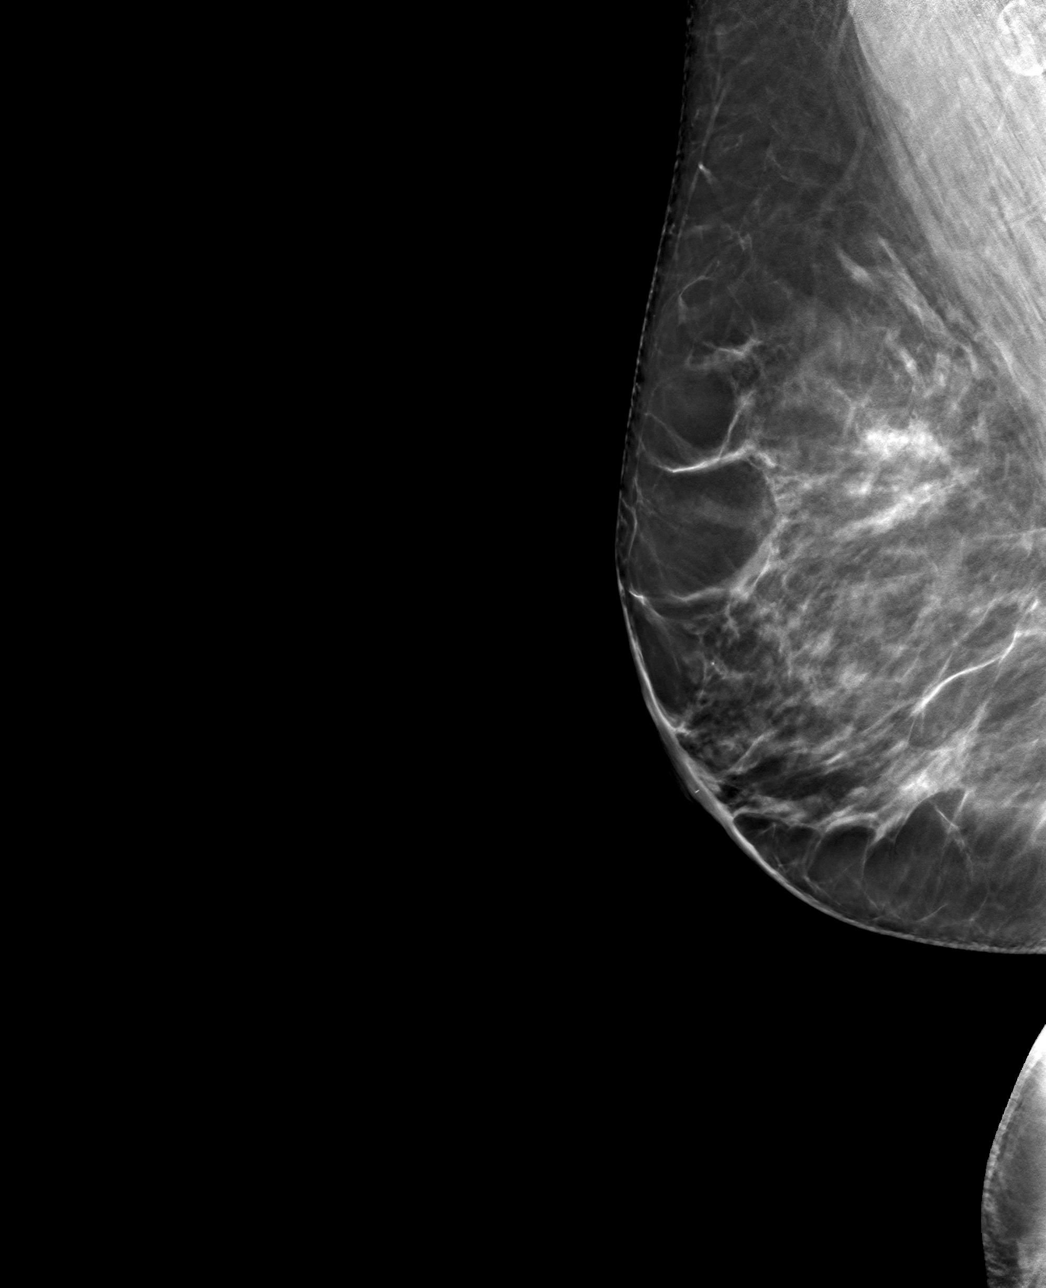

[R CC tomo · tomo slice 41/81.0]
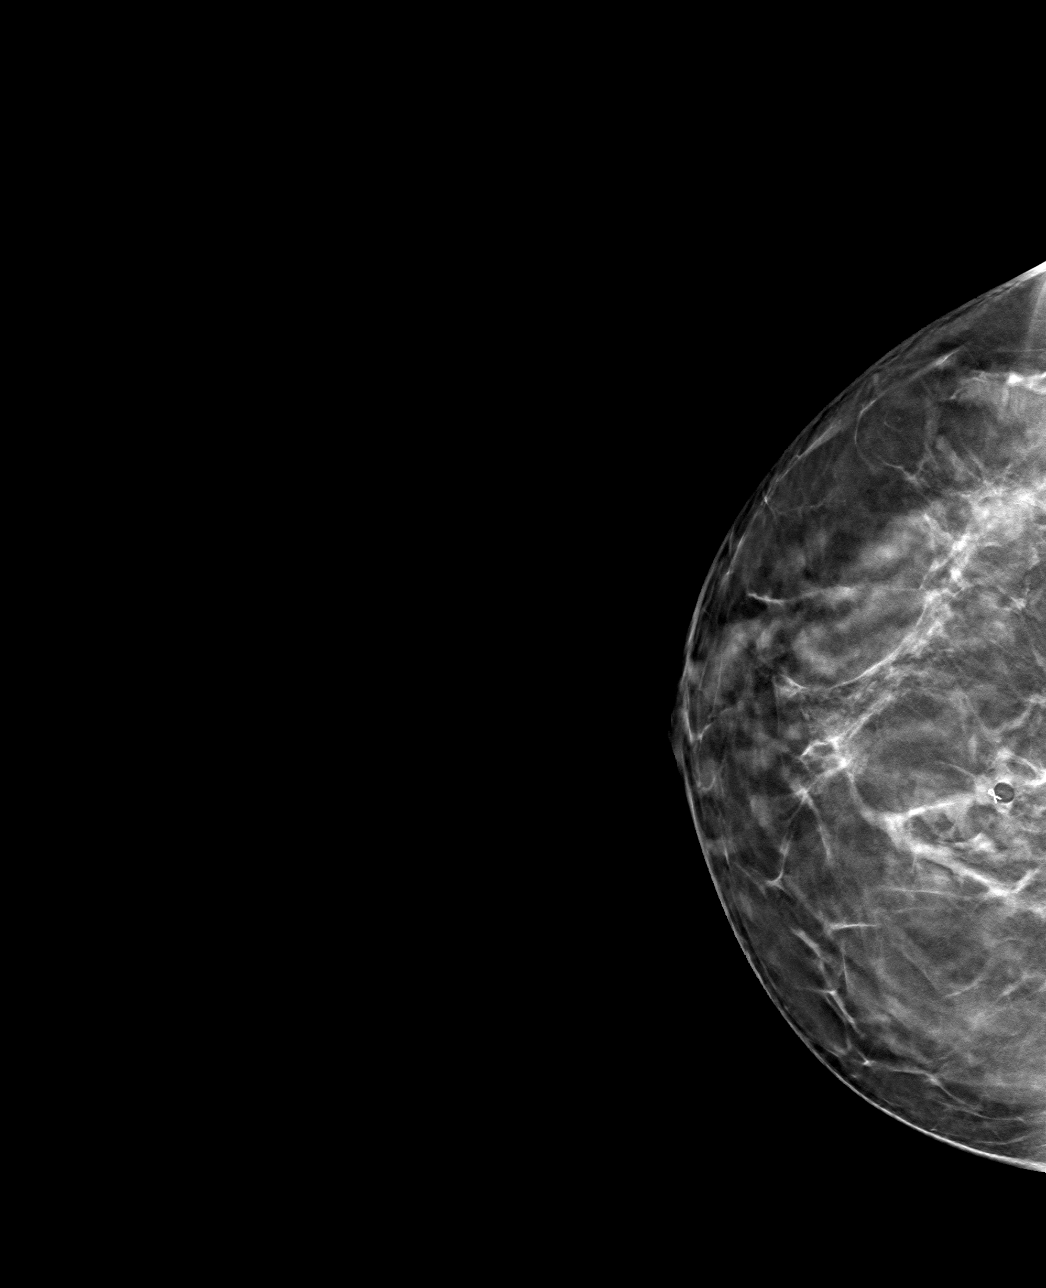

[4 of 12 positions shown; findings below may reference images not displayed]

FINDINGS: 3D Mammographic images were obtained following ultrasound guided
biopsy of mass in the UPPER INNER QUADRANT of the RIGHT breast and
placement of a ribbon shaped clip. The biopsy marking clip is in
expected position at the site of biopsy.
IMPRESSION: Appropriate positioning of the ribbon shaped biopsy marking clip at
the site of biopsy in the UPPER INNER QUADRANT RIGHT breast.

Final Assessment: Post Procedure Mammograms for Marker Placement

## 2022-02-19 IMAGING — MG US  BREAST BX W/ LOC DEV 1ST LESION IMG BX SPEC US GUIDE*R*
1 series · 8 of 8 positions shown · non-contrast
Comparison: Previous exam(s).
COMPARISON: Previous exam(s).

Addendum:
CLINICAL DATA: Patient presents for ultrasound-guided core biopsy
of RIGHT breast mass.

EXAM:
ULTRASOUND GUIDED RIGHT BREAST CORE NEEDLE BIOPSY

[Series 1: MG view · 0.07mm/px · 8 of 12 slices shown]
[im 1/12]
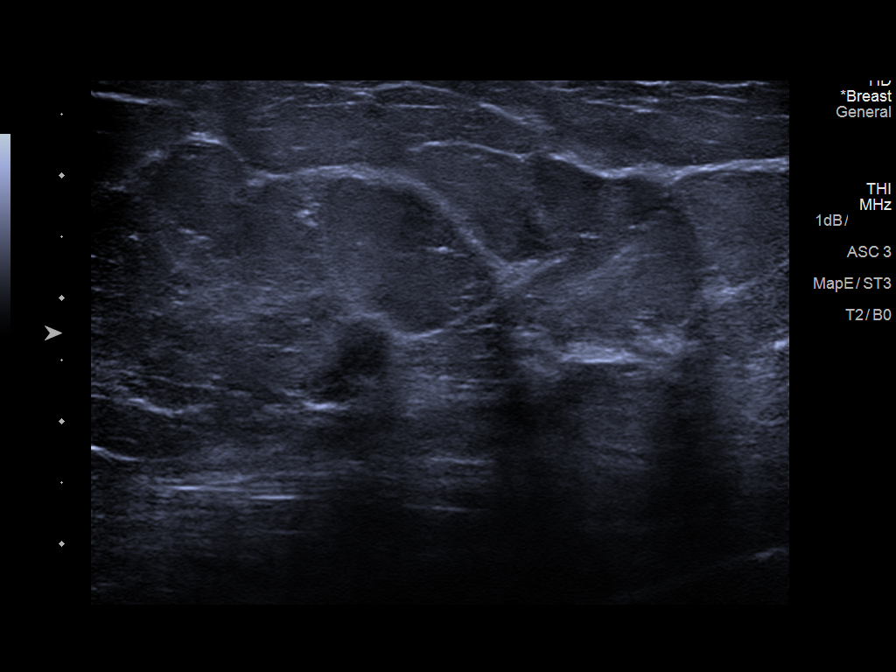
[im 2/12]
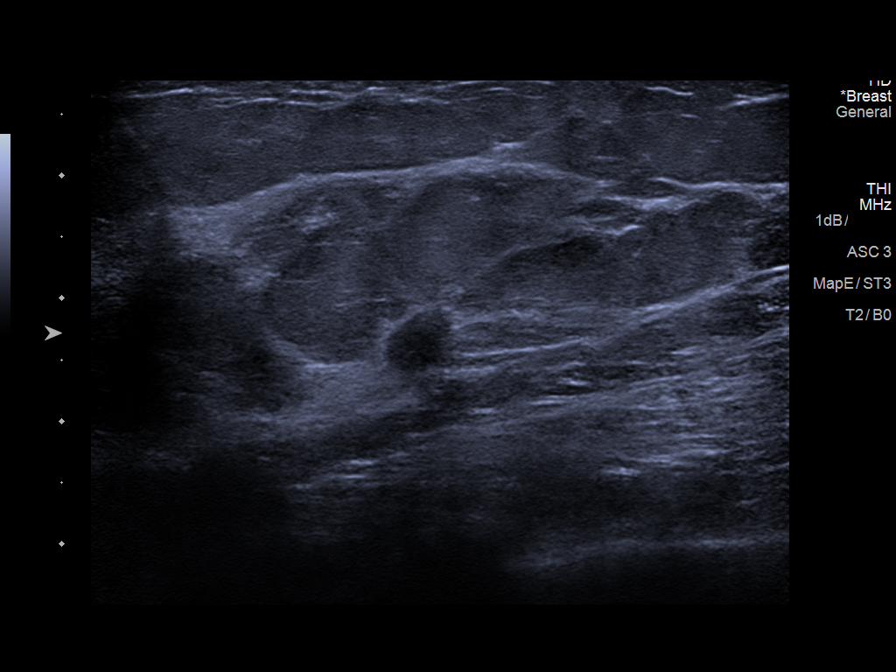
[im 4/12]
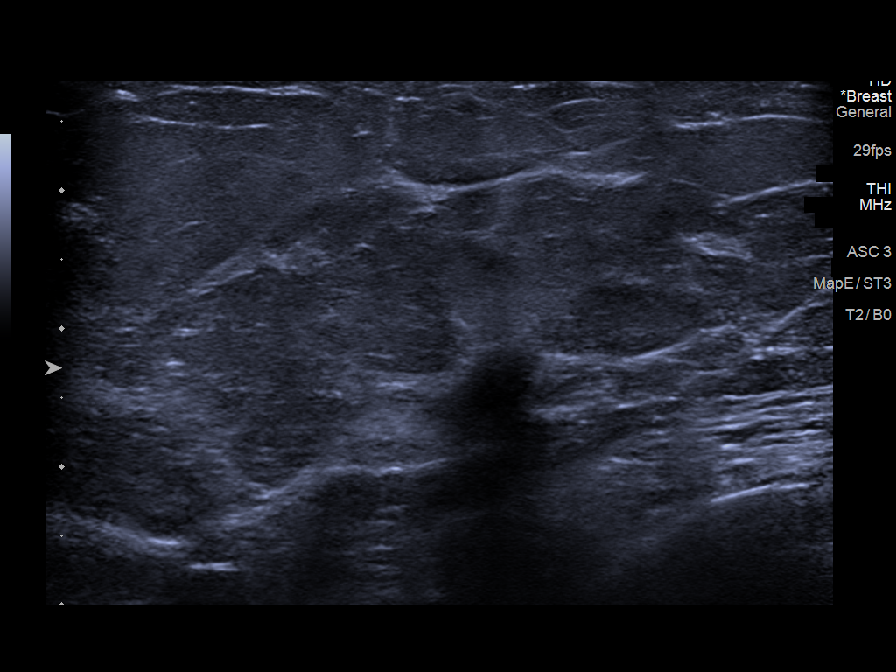
[im 5/12]
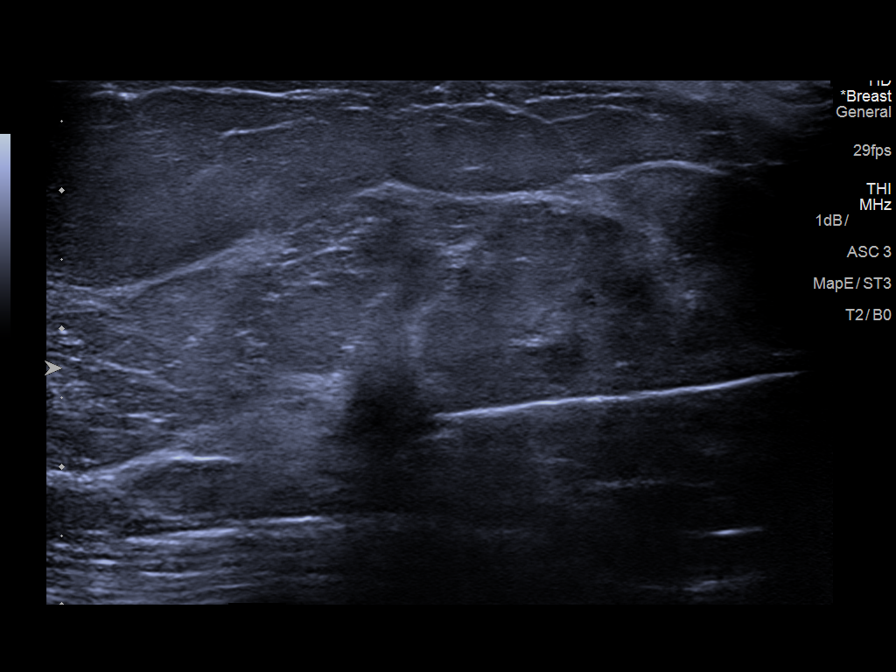
[im 7/12]
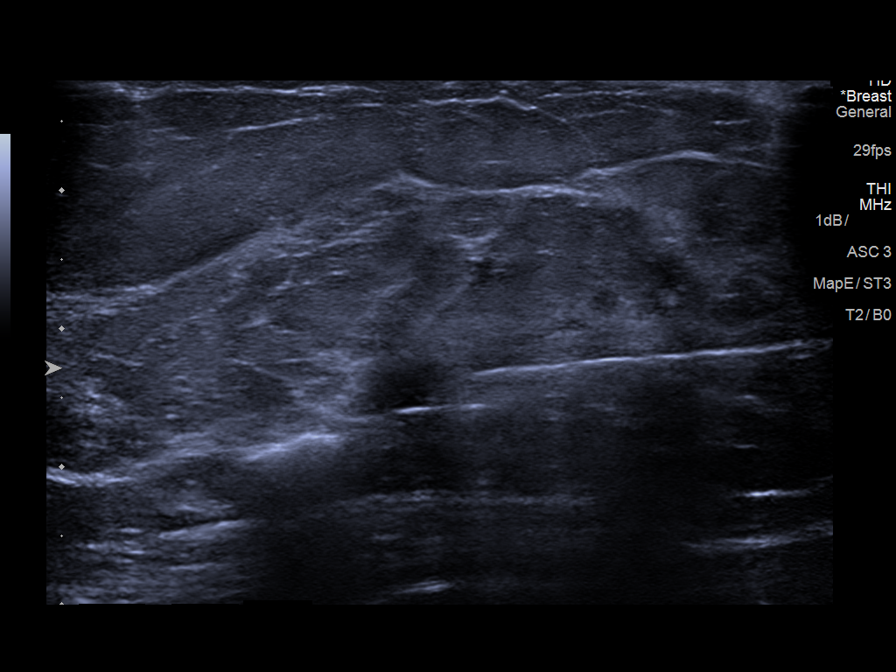
[im 8/12]
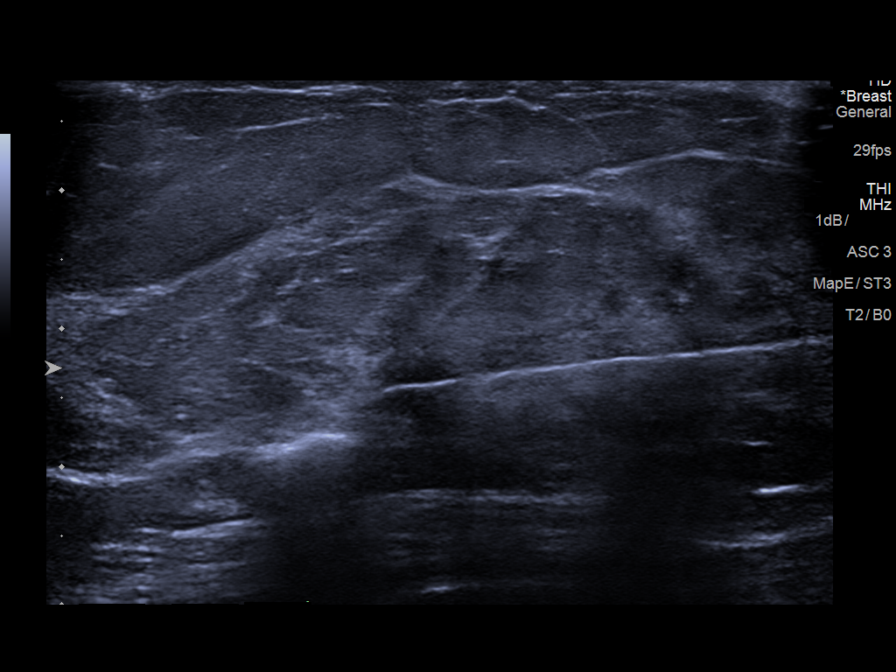
[im 10/12]
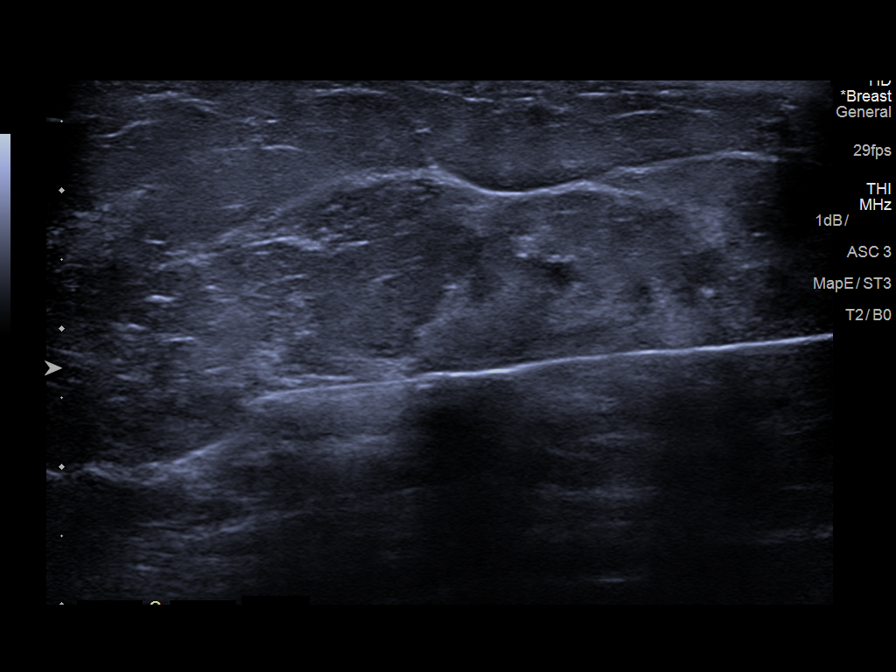
[im 12/12]
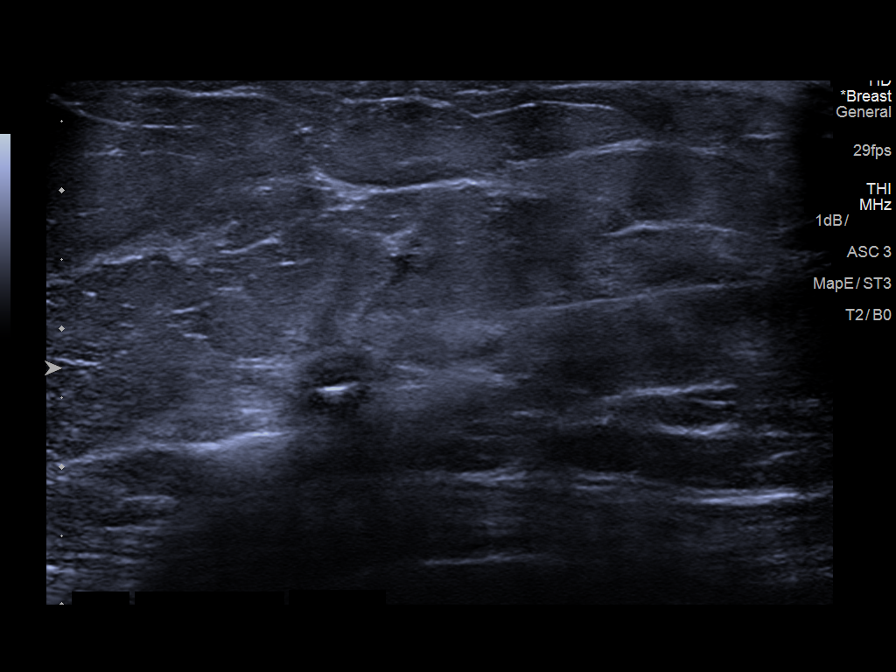

[8 of 8 positions shown; findings below may reference images not displayed]



Lesion quadrant: UPPER INNER QUADRANT RIGHT breast

Using sterile technique and 1% Lidocaine as local anesthetic, under
direct ultrasound visualization, a 12 gauge Andreikenas device was
used to perform biopsy of mass in the UPPER INNER QUADRANT of the
RIGHT using a MEDIAL to LATERAL approach. At the conclusion of the
procedure ribbon shaped tissue marker clip was deployed into the
biopsy cavity. Follow up 2 view mammogram was performed and dictated
separately.
IMPRESSION: Ultrasound guided biopsy of RIGHT breast mass. No apparent
complications.

ADDENDUM:
PATHOLOGY revealed: A. BREAST, RIGHT, 1 O'CLOCK 6 CM FROM NIPPLE;
ULTRASOUND-GUIDED CORE BIOPSY:- INVASIVE MAMMARY CARCINOMA, NO
SPECIAL TYPE. Size of invasive carcinoma: 5 mm in this sample. Grade
1. Ductal carcinoma in situ: Not identified. Lymphovascular
invasion: Not identified.

Pathology results are CONCORDANT with imaging findings, per Dr.
Quirijn Amazigh.

Pathology results and recommendations were discussed with patient
via telephone on 10/01/2020. Patient reported doing well after the
biopsy with no adverse symptoms, and only slight tenderness at the
site. Post biopsy care instructions were reviewed, questions were
answered and my direct phone number was provided. Patient was
instructed to call [HOSPITAL] for any additional
questions or concerns related to biopsy site.

RECOMMENDATIONS:

1. Surgical consultation. I spoke with Gia Maka Bedeladze RN at provider
office on 10/02/2020; she will inform provider regarding the need to
arrange surgical consultation for patient, and contact patient with
appointment information. Nurse navigators at [HOSPITAL]
[HOSPITAL] were notified that patient desires breast surgeon at
[facility] or [REDACTED], and provider will arrange these
appointments.

2. Consider bilateral breast MRI given patient's age and
heterogeneously dense breast tissue, which may obscure small masses.

Pathology results reported by Hodjat Abee RN on 10/03/2020.



Lesion quadrant: UPPER INNER QUADRANT RIGHT breast

Using sterile technique and 1% Lidocaine as local anesthetic, under
direct ultrasound visualization, a 12 gauge Andreikenas device was
used to perform biopsy of mass in the UPPER INNER QUADRANT of the
RIGHT using a MEDIAL to LATERAL approach. At the conclusion of the
procedure ribbon shaped tissue marker clip was deployed into the
biopsy cavity. Follow up 2 view mammogram was performed and dictated
separately.
IMPRESSION: Ultrasound guided biopsy of RIGHT breast mass. No apparent
complications.

## 2022-02-27 DIAGNOSIS — E785 Hyperlipidemia, unspecified: Secondary | ICD-10-CM | POA: Diagnosis not present

## 2022-02-27 DIAGNOSIS — Z170 Estrogen receptor positive status [ER+]: Secondary | ICD-10-CM | POA: Diagnosis not present

## 2022-02-27 DIAGNOSIS — C50911 Malignant neoplasm of unspecified site of right female breast: Secondary | ICD-10-CM | POA: Diagnosis not present

## 2022-03-19 DIAGNOSIS — C4441 Basal cell carcinoma of skin of scalp and neck: Secondary | ICD-10-CM | POA: Diagnosis not present

## 2022-03-30 ENCOUNTER — Encounter: Payer: BC Managed Care – PPO | Admitting: Internal Medicine

## 2022-04-02 ENCOUNTER — Ambulatory Visit (INDEPENDENT_AMBULATORY_CARE_PROVIDER_SITE_OTHER): Payer: BC Managed Care – PPO | Admitting: Internal Medicine

## 2022-04-02 ENCOUNTER — Encounter: Payer: Self-pay | Admitting: Internal Medicine

## 2022-04-02 VITALS — BP 106/72 | HR 73 | Ht 69.0 in | Wt 214.0 lb

## 2022-04-02 DIAGNOSIS — F259 Schizoaffective disorder, unspecified: Secondary | ICD-10-CM

## 2022-04-02 DIAGNOSIS — C50211 Malignant neoplasm of upper-inner quadrant of right female breast: Secondary | ICD-10-CM | POA: Diagnosis not present

## 2022-04-02 DIAGNOSIS — N3281 Overactive bladder: Secondary | ICD-10-CM

## 2022-04-02 DIAGNOSIS — E785 Hyperlipidemia, unspecified: Secondary | ICD-10-CM | POA: Diagnosis not present

## 2022-04-02 DIAGNOSIS — Z170 Estrogen receptor positive status [ER+]: Secondary | ICD-10-CM | POA: Diagnosis not present

## 2022-04-02 DIAGNOSIS — Z0000 Encounter for general adult medical examination without abnormal findings: Secondary | ICD-10-CM

## 2022-04-02 MED ORDER — DIVALPROEX SODIUM 500 MG PO DR TAB: 500.0000 mg | DELAYED_RELEASE_TABLET | Freq: Three times a day (TID) | ORAL | 3 refills | Status: DC

## 2022-04-02 MED ORDER — OXYBUTYNIN CHLORIDE ER 5 MG PO TB24: 5.0000 mg | ORAL_TABLET | Freq: Every day | ORAL | 3 refills | Status: DC

## 2022-04-02 NOTE — Progress Notes (Signed)
Date:  04/02/2022   Name:  Beth Lewis   DOB:  04/19/1970   MRN:  [id number]   Chief Complaint: Annual Exam (Breast exam no pap ) Beth Lewis is a 52 y.o. female who presents today for her Complete Annual Exam. She feels well. She reports exercising walking 1 day a week. She reports she is sleeping well. Breast complaints none.  Mammogram: 08/2020 - invasive ductal carcinoma; Mammogram planned 05/2022 DEXA: none Pap smear: 12/2021 neg/neg Colonoscopy: 09/2021 repeat 10 yrs  Health Maintenance Due  Topic Date Due   Zoster Vaccines- Shingrix (1 of 2) Never done   due next year   Immunization History  Administered Date(s) Administered   Influenza Inj Mdck Quad With Preservative 11/04/2017   Influenza Split 12/04/2019   Influenza,inj,Quad PF,6+ Mos 01/04/2017, 11/01/2018, 10/19/2021   Influenza-Unspecified 10/17/2020   PFIZER Comirnaty(Gray Top)Covid-19 Tri-Sucrose Vaccine 03/20/2019, 04/09/2019   Tdap 02/20/2016    HPI Schizoaffective disorder - on Risperidone and Depakote.  Doing well on current doses for years.  Lab Results  Component Value Date   NA 139 03/20/2021   K 4.9 03/20/2021   CO2 23 03/20/2021   GLUCOSE 85 03/20/2021   BUN 16 03/20/2021   CREATININE 0.94 03/20/2021   CALCIUM 9.9 03/20/2021   EGFR 74 03/20/2021   GFRNONAA 75 03/11/2020   Lab Results  Component Value Date   CHOL 148 03/20/2021   HDL 38 (L) 03/20/2021   LDLCALC 84 03/20/2021   TRIG 150 (H) 03/20/2021   CHOLHDL 3.9 03/20/2021   Lab Results  Component Value Date   TSH 2.950 03/20/2021   Lab Results  Component Value Date   HGBA1C 5.4 03/20/2021   Lab Results  Component Value Date   WBC 8.0 03/20/2021   HGB 13.7 03/20/2021   HCT 40.7 03/20/2021   MCV 96 03/20/2021   PLT 289 03/20/2021   Lab Results  Component Value Date   ALT 16 03/20/2021   AST 18 03/20/2021   ALKPHOS 72 03/20/2021   BILITOT 0.3 03/20/2021   No results found for: "25OHVITD2", "25OHVITD3",  "VD25OH"   Review of Systems  Constitutional:  Negative for chills, fatigue and fever.  HENT:  Negative for congestion, hearing loss, tinnitus, trouble swallowing and voice change.   Eyes:  Negative for visual disturbance.  Respiratory:  Negative for cough, chest tightness, shortness of breath and wheezing.   Cardiovascular:  Negative for chest pain, palpitations and leg swelling.  Gastrointestinal:  Negative for abdominal pain, constipation, diarrhea and vomiting.  Endocrine: Negative for polydipsia and polyuria.  Genitourinary:  Negative for dysuria, frequency, genital sores, vaginal bleeding and vaginal discharge.  Musculoskeletal:  Negative for arthralgias, gait problem and joint swelling.  Skin:  Negative for color change and rash.  Neurological:  Negative for dizziness, tremors, light-headedness and headaches.  Hematological:  Negative for adenopathy. Does not bruise/bleed easily.  Psychiatric/Behavioral:  Negative for dysphoric mood and sleep disturbance. The patient is not nervous/anxious.     Patient Active Problem List   Diagnosis Date Noted   Malignant neoplasm of upper-inner quadrant of right breast in female, estrogen receptor positive ([place]) 10/01/2020   OAB (overactive bladder) 01/05/2018   Urge incontinence of urine 01/05/2018   Dyslipidemia 07/16/2014   Migraine without aura and responsive to treatment 07/16/2014   Chronic schizoaffective disorder ([place]) 07/16/2014    No Known Allergies  Past Surgical History:  Procedure Laterality Date   BASAL CELL CARCINOMA EXCISION     BREAST  BIOPSY Right 09/30/2020   u/s bx-"ribbon" clip-path pending   COLONOSCOPY WITH PROPOFOL N/A 09/26/2021   Procedure: COLONOSCOPY WITH PROPOFOL;  Surgeon: Lin Landsman, MD;  Location: [facility] ENDOSCOPY;  Service: Gastroenterology;  Laterality: N/A;   MASTECTOMY, PARTIAL Right 10/2020   MOHS SURGERY  2012    Social History   Tobacco Use   Smoking status: Never   Smokeless tobacco:  Never  Vaping Use   Vaping Use: Never used  Substance Use Topics   Alcohol use: Yes    Alcohol/week: 0.0 standard drinks of alcohol    Comment: occasional   Drug use: No     Medication list has been reviewed and updated.  Current Meds  Medication Sig   risperiDONE (RISPERDAL) 1 MG tablet TAKE 1 TABLET AT BEDTIME   tamoxifen (NOLVADEX) 20 MG tablet Take 20 mg by mouth daily.   [DISCONTINUED] divalproex (DEPAKOTE) 500 MG DR tablet Take by mouth.   [DISCONTINUED] oxybutynin (DITROPAN-XL) 5 MG 24 hr tablet Take 1 tablet by mouth daily.   [DISCONTINUED] Probiotic Product (PROBIOTIC PO) Take by mouth.       04/02/2022   10:09 AM 04/16/2021    1:55 PM 04/09/2021   11:14 AM 03/20/2021    9:06 AM  GAD 7 : Generalized Anxiety Score  Nervous, Anxious, on Edge 0 0 0 0  Control/stop worrying 0 0 0 0  Worry too much - different things 0 0 0 0  Trouble relaxing 0 0 0 0  Restless 0 0 0 0  Easily annoyed or irritable 0 0 0 0  Afraid - awful might happen 0 0 0 0  Total GAD 7 Score 0 0 0 0  Anxiety Difficulty Not difficult at all Not difficult at all Not difficult at all Not difficult at all       04/02/2022   10:09 AM 04/16/2021    1:55 PM 04/09/2021   11:14 AM  Depression screen PHQ 2/9  Decreased Interest 0 0 0  Down, Depressed, Hopeless 0 0 0  PHQ - 2 Score 0 0 0  Altered sleeping 0 0 0  Tired, decreased energy 1 0 0  Change in appetite 0 0 0  Feeling bad or failure about yourself  0 0 0  Trouble concentrating 0 0 0  Moving slowly or fidgety/restless 0 0 0  Suicidal thoughts 0 0 0  PHQ-9 Score 1 0 0  Difficult doing work/chores Not difficult at all Not difficult at all Not difficult at all    BP Readings from Last 3 Encounters:  04/02/22 106/72  09/26/21 102/63  06/18/21 133/85    Physical Exam Vitals and nursing note reviewed.  Constitutional:      General: She is not in acute distress.    Appearance: She is well-developed.  HENT:     Head: Normocephalic and  atraumatic.     Right Ear: Tympanic membrane and ear canal normal.     Left Ear: Tympanic membrane and ear canal normal.     Nose:     Right Sinus: No maxillary sinus tenderness.     Left Sinus: No maxillary sinus tenderness.  Eyes:     General: No scleral icterus.       Right eye: No discharge.        Left eye: No discharge.     Conjunctiva/sclera: Conjunctivae normal.  Neck:     Thyroid: No thyromegaly.     Vascular: No carotid bruit.  Cardiovascular:  Rate and Rhythm: Normal rate and regular rhythm.     Pulses: Normal pulses.     Heart sounds: Normal heart sounds.  Pulmonary:     Effort: Pulmonary effort is normal. No respiratory distress.     Breath sounds: No wheezing.  Chest:  Breasts:    Right: Skin change present. No mass, nipple discharge or tenderness.     Left: No mass, nipple discharge, skin change or tenderness.     Comments: Outer nipple s/p XRT Abdominal:     General: Bowel sounds are normal.     Palpations: Abdomen is soft.     Tenderness: There is no abdominal tenderness.  Musculoskeletal:     Cervical back: Normal range of motion. No erythema.     Right lower leg: No edema.     Left lower leg: No edema.  Lymphadenopathy:     Cervical: No cervical adenopathy.  Skin:    General: Skin is warm and dry.     Findings: No rash.  Neurological:     Mental Status: She is alert and oriented to person, place, and time.     Cranial Nerves: No cranial nerve deficit.     Sensory: No sensory deficit.     Deep Tendon Reflexes: Reflexes are normal and symmetric.  Psychiatric:        Attention and Perception: Attention normal.        Mood and Affect: Mood normal.     Wt Readings from Last 3 Encounters:  04/02/22 214 lb (97.1 kg)  09/26/21 200 lb (90.7 kg)  06/18/21 210 lb (95.3 kg)    BP 106/72   Pulse 73   Ht '5\' 9"'$  (1.753 m)   Wt 214 lb (97.1 kg)   LMP 01/19/2022 (Approximate) Comment: irregular  SpO2 99%   BMI 31.60 kg/m   Assessment and  Plan:  Problem List Items Addressed This Visit       Genitourinary   OAB (overactive bladder)   Relevant Medications   oxybutynin (DITROPAN-XL) 5 MG 24 hr tablet     Other   Chronic schizoaffective disorder (HCC)    Stable without concerns Taking Risperidone and Depakote daily for years      Relevant Medications   divalproex (DEPAKOTE) 500 MG DR tablet   Dyslipidemia    Borderline elevated lipids not requiring medication Continue low fat diet       Relevant Orders   Lipid panel   Malignant neoplasm of upper-inner quadrant of right breast in female, estrogen receptor positive ([place])    Completed XRT and started Tamoxifen 01/2021 Doing well Mammograms ordered by Oncology      Relevant Orders   CBC with Differential/Platelet   Comprehensive metabolic panel   Other Visit Diagnoses     Annual physical exam    -  Primary   Normal exam continue work on healthy diet and exercise declines Shingles vaccines today   Relevant Orders   CBC with Differential/Platelet   Comprehensive metabolic panel   Lipid panel   TSH   Hemoglobin A1c       No follow-ups on file.   Partially dictated using [place], any errors are not intentional.  [name], MD [place], [place]

## 2022-04-02 NOTE — Assessment & Plan Note (Signed)
Borderline elevated lipids not requiring medication Continue low fat diet

## 2022-04-02 NOTE — Assessment & Plan Note (Addendum)
Completed XRT and started Tamoxifen 01/2021 Doing well Mammograms ordered by Oncology

## 2022-04-02 NOTE — Assessment & Plan Note (Addendum)
Stable without concerns Taking Risperidone and Depakote daily for years

## 2022-04-03 LAB — LIPID PANEL
Chol/HDL Ratio: 3.7 ratio (ref 0.0–4.4)
Cholesterol, Total: 154 mg/dL (ref 100–199)
HDL: 42 mg/dL (ref >39)
LDL Chol Calc (NIH): 82 mg/dL (ref 0–99)
Triglycerides: 178 mg/dL — ABNORMAL HIGH (ref 0–149)
VLDL Cholesterol Cal: 30 mg/dL (ref 5–40)

## 2022-04-03 LAB — CBC WITH DIFFERENTIAL/PLATELET
Basophils Absolute: 0 10*3/uL (ref 0.0–0.2)
Basos: 1 %
EOS (ABSOLUTE): 0.1 10*3/uL (ref 0.0–0.4)
Eos: 1 %
Hematocrit: 39.6 % (ref 34.0–46.6)
Hemoglobin: 13.1 g/dL (ref 11.1–15.9)
Immature Grans (Abs): 0 10*3/uL (ref 0.0–0.1)
Immature Granulocytes: 1 %
Lymphocytes Absolute: 2.3 10*3/uL (ref 0.7–3.1)
Lymphs: 33 %
MCH: 32 pg (ref 26.6–33.0)
MCHC: 33.1 g/dL (ref 31.5–35.7)
MCV: 97 fL (ref 79–97)
Monocytes Absolute: 0.5 10*3/uL (ref 0.1–0.9)
Monocytes: 7 %
Neutrophils Absolute: 4.1 10*3/uL (ref 1.4–7.0)
Neutrophils: 57 %
Platelets: 298 10*3/uL (ref 150–450)
RBC: 4.1 x10E6/uL (ref 3.77–5.28)
RDW: 12.2 % (ref 11.7–15.4)
WBC: 7 10*3/uL (ref 3.4–10.8)

## 2022-04-03 LAB — COMPREHENSIVE METABOLIC PANEL
ALT: 32 IU/L (ref 0–32)
AST: 23 IU/L (ref 0–40)
Albumin/Globulin Ratio: 1.9 (ref 1.2–2.2)
Albumin: 4.3 g/dL (ref 3.9–4.9)
Alkaline Phosphatase: 88 IU/L (ref 44–121)
BUN/Creatinine Ratio: 17 (ref 9–23)
BUN: 14 mg/dL (ref 6–24)
Bilirubin Total: 0.3 mg/dL (ref 0.0–1.2)
CO2: 22 mmol/L (ref 20–29)
Calcium: 9.8 mg/dL (ref 8.7–10.2)
Chloride: 101 mmol/L (ref 96–106)
Creatinine, Ser: 0.84 mg/dL (ref 0.57–1.00)
Globulin, Total: 2.3 g/dL (ref 1.5–4.5)
Glucose: 89 mg/dL (ref 70–99)
Potassium: 4.4 mmol/L (ref 3.5–5.2)
Sodium: 136 mmol/L (ref 134–144)
Total Protein: 6.6 g/dL (ref 6.0–8.5)
eGFR: 85 mL/min/{1.73_m2} (ref >59)

## 2022-04-03 LAB — TSH: TSH: 2.57 u[IU]/mL (ref 0.450–4.500)

## 2022-04-03 LAB — HEMOGLOBIN A1C
Est. average glucose Bld gHb Est-mCnc: 108 mg/dL
Hgb A1c MFr Bld: 5.4 % (ref 4.8–5.6)

## 2022-05-14 ENCOUNTER — Other Ambulatory Visit: Payer: Self-pay | Admitting: Internal Medicine

## 2022-05-14 DIAGNOSIS — F259 Schizoaffective disorder, unspecified: Secondary | ICD-10-CM

## 2022-05-14 NOTE — Telephone Encounter (Signed)
Requested medication (s) are due for refill today- yes  Requested medication (s) are on the active medication list -yes  Future visit scheduled -yes  Last refill: 05/19/21 #90 3RF  Notes to clinic: non delegated Rx  Requested Prescriptions  Pending Prescriptions Disp Refills   risperiDONE (RISPERDAL) 1 MG tablet [Pharmacy Med Name: RISPERIDONE TABS ] 90 tablet 3    Sig: TAKE 1 TABLET AT BEDTIME     Not Delegated - Psychiatry:  Antipsychotics - Second Generation (Atypical) - risperidone Failed - 05/14/2022 12:09 AM      Failed - This refill cannot be delegated      Failed - Lipid Panel in normal range within the last 12 months    Cholesterol, Total  Date Value Ref Range Status  04/02/2022 154 100 - 199 mg/dL Final   LDL Chol Calc (NIH)  Date Value Ref Range Status  04/02/2022 82 0 - 99 mg/dL Final   HDL  Date Value Ref Range Status  04/02/2022 42 >39 mg/dL Final   Triglycerides  Date Value Ref Range Status  04/02/2022 178 (H) 0 - 149 mg/dL Final         Passed - TSH in normal range and within 360 days    TSH  Date Value Ref Range Status  04/02/2022 2.570 0.450 - 4.500 uIU/mL Final         Passed - Completed PHQ-2 or PHQ-9 in the last 360 days      Passed - Last BP in normal range    BP Readings from Last 1 Encounters:  04/02/22 106/72         Passed - Last Heart Rate in normal range    Pulse Readings from Last 1 Encounters:  04/02/22 73         Passed - Valid encounter within last 6 months    Recent Outpatient Visits           1 month ago Annual physical exam   [place] [facility], Nyoka Cowden, MD   1 year ago Bilateral otitis media, unspecified otitis media type   [facility] at [facility], Nyoka Cowden, MD   1 year ago Bronchitis with bronchospasm   [place] [facility] at [facility], Nyoka Cowden, MD   1 year ago Annual physical  exam   [facility] at [facility], Nyoka Cowden, MD   1 year ago Acute cystitis without hematuria   [facility] at [facility], Nyoka Cowden, MD       Future Appointments             In 10 months Judithann Graves Nyoka Cowden, MD [facility] at [facility]            Passed - CBC within normal limits and completed in the last 12 months    WBC  Date Value Ref Range Status  04/02/2022 7.0 3.4 - 10.8 x10E3/uL Final   RBC  Date Value Ref Range Status  04/02/2022 4.10 3.77 - 5.28 x10E6/uL Final  10/29/2020 0 (A) 4.04 - 5.48 M/uL Final   Hemoglobin  Date Value Ref Range Status  04/02/2022 13.1 11.1 - 15.9 g/dL Final   Hematocrit  Date Value Ref Range Status  04/02/2022 39.6 34.0 - 46.6 % Final   MCHC  Date Value Ref Range Status  04/02/2022 33.1 31.5 - 35.7 g/dL Final   [facility]  Date Value Ref Range Status  04/02/2022 32.0 26.6 - 33.0 pg Final   MCV  Date Value Ref Range Status  04/02/2022 97 79 - 97 fL Final   No results found for: "PLTCOUNTKUC", "LABPLAT", "POCPLA" RDW  Date Value Ref Range Status  04/02/2022 12.2 11.7 - 15.4 % Final         Passed - CMP within normal limits and completed in the last 12 months    Albumin  Date Value Ref Range Status  04/02/2022 4.3 3.9 - 4.9 g/dL Final   Alkaline Phosphatase  Date Value Ref Range Status  04/02/2022 88 44 - 121 IU/L Final   ALT  Date Value Ref Range Status  04/02/2022 32 0 - 32 IU/L Final   AST  Date Value Ref Range Status  04/02/2022 23 0 - 40 IU/L Final   BUN  Date Value Ref Range Status  04/02/2022 14 6 - 24 mg/dL Final   Calcium  Date Value Ref Range Status  04/02/2022 9.8 8.7 - 10.2 mg/dL Final   CO2  Date Value Ref Range Status  04/02/2022 22 20 - 29 mmol/L Final   Creatinine, Ser  Date Value Ref Range Status  04/02/2022 0.84 0.57 - 1.00 mg/dL Final   Glucose  Date Value Ref  Range Status  04/02/2022 89 70 - 99 mg/dL Final   Potassium  Date Value Ref Range Status  04/02/2022 4.4 3.5 - 5.2 mmol/L Final   Sodium  Date Value Ref Range Status  04/02/2022 136 134 - 144 mmol/L Final   Bilirubin Total  Date Value Ref Range Status  04/02/2022 0.3 0.0 - 1.2 mg/dL Final   Protein, ur  Date Value Ref Range Status  06/10/2016 NEGATIVE NEGATIVE mg/dL Final   Protein, UA  Date Value Ref Range Status  10/29/2020 Negative Negative Final   Total Protein  Date Value Ref Range Status  04/02/2022 6.6 6.0 - 8.5 g/dL Final   GFR calc Af Amer  Date Value Ref Range Status  03/11/2020 86 >59 mL/min/1.73 Final    Comment:    **In accordance with recommendations from the NKF-ASN Task force,**   [facility] is in the process of updating its eGFR calculation to the   2021 CKD-EPI creatinine equation that estimates kidney function   without a race variable.    eGFR  Date Value Ref Range Status  04/02/2022 85 >59 mL/min/1.73 Final   GFR calc non Af Amer  Date Value Ref Range Status  03/11/2020 75 >59 mL/min/1.73 Final            Requested Prescriptions  Pending Prescriptions Disp Refills   risperiDONE (RISPERDAL) 1 MG tablet [Pharmacy Med Name: RISPERIDONE TABS 1MG ] 90 tablet 3    Sig: TAKE 1 TABLET AT BEDTIME     Not Delegated - Psychiatry:  Antipsychotics - Second Generation (Atypical) - risperidone Failed - 05/14/2022 12:09 AM      Failed - This refill cannot be delegated      Failed - Lipid Panel in normal range within the last 12 months    Cholesterol, Total  Date Value Ref Range Status  04/02/2022 154 100 - 199 mg/dL Final   LDL Chol Calc (NIH)  Date Value Ref Range Status  04/02/2022 82 0 - 99 mg/dL Final   HDL  Date Value Ref Range Status  04/02/2022 42 >39 mg/dL Final   Triglycerides  Date Value Ref Range Status  04/02/2022 178 (H) 0 - 149 mg/dL Final         Passed - TSH in normal range and within 360 days    TSH  Date Value Ref  Range Status  04/02/2022 2.570 0.450 - 4.500 uIU/mL Final         Passed - Completed PHQ-2 or PHQ-9 in the last 360 days      Passed - Last BP in normal range    BP Readings from Last 1 Encounters:  04/02/22 106/72         Passed - Last Heart Rate in normal range    Pulse Readings from Last 1 Encounters:  04/02/22 73         Passed - Valid encounter within last 6 months    Recent Outpatient Visits           1 month ago Annual physical exam   [place] [facility] at [facility], Nyoka Cowden, MD   1 year ago Bilateral otitis media, unspecified otitis media type   [facility] at [facility], Nyoka Cowden, MD   1 year ago Bronchitis with bronchospasm   [place] [facility] at [facility], Nyoka Cowden, MD   1 year ago Annual physical exam   [facility] at [facility], Nyoka Cowden, MD   1 year ago Acute cystitis without hematuria   [place] [facility] at [facility], Nyoka Cowden, MD       Future Appointments             In 10 months Judithann Graves Nyoka Cowden, MD [facility] at [facility]            Passed - CBC within normal limits and completed in the last 12 months    WBC  Date Value Ref Range Status  04/02/2022 7.0 3.4 - 10.8 x10E3/uL Final   RBC  Date Value Ref Range Status  04/02/2022 4.10 3.77 - 5.28 x10E6/uL Final  10/29/2020 0 (A) 4.04 - 5.48 M/uL Final   Hemoglobin  Date Value Ref Range Status  04/02/2022 13.1 11.1 - 15.9 g/dL Final   Hematocrit  Date Value Ref Range Status  04/02/2022 39.6 34.0 - 46.6 % Final   MCHC  Date Value Ref Range Status  04/02/2022 33.1 31.5 - 35.7 g/dL Final   [facility]  Date Value Ref Range Status  04/02/2022 32.0 26.6 - 33.0 pg Final   MCV  Date Value Ref Range Status  04/02/2022 97 79 - 97 fL Final   No results  found for: "PLTCOUNTKUC", "LABPLAT", "POCPLA" RDW  Date Value Ref Range Status  04/02/2022 12.2 11.7 - 15.4 % Final         Passed - CMP within normal limits and completed in the last 12 months    Albumin  Date Value Ref Range Status  04/02/2022 4.3 3.9 - 4.9 g/dL Final   Alkaline Phosphatase  Date Value Ref Range Status  04/02/2022 88 44 - 121 IU/L Final   ALT  Date Value Ref Range Status  04/02/2022 32 0 - 32 IU/L Final   AST  Date Value Ref Range Status  04/02/2022 23 0 - 40 IU/L Final   BUN  Date Value Ref Range Status  04/02/2022 14 6 - 24 mg/dL  Final   Calcium  Date Value Ref Range Status  04/02/2022 9.8 8.7 - 10.2 mg/dL Final   CO2  Date Value Ref Range Status  04/02/2022 22 20 - 29 mmol/L Final   Creatinine, Ser  Date Value Ref Range Status  04/02/2022 0.84 0.57 - 1.00 mg/dL Final   Glucose  Date Value Ref Range Status  04/02/2022 89 70 - 99 mg/dL Final   Potassium  Date Value Ref Range Status  04/02/2022 4.4 3.5 - 5.2 mmol/L Final   Sodium  Date Value Ref Range Status  04/02/2022 136 134 - 144 mmol/L Final   Bilirubin Total  Date Value Ref Range Status  04/02/2022 0.3 0.0 - 1.2 mg/dL Final   Protein, ur  Date Value Ref Range Status  06/10/2016 NEGATIVE NEGATIVE mg/dL Final   Protein, UA  Date Value Ref Range Status  10/29/2020 Negative Negative Final   Total Protein  Date Value Ref Range Status  04/02/2022 6.6 6.0 - 8.5 g/dL Final   GFR calc Af Amer  Date Value Ref Range Status  03/11/2020 86 >59 mL/min/1.73 Final    Comment:    **In accordance with recommendations from the NKF-ASN Task force,**   [facility] is in the process of updating its eGFR calculation to the   2021 CKD-EPI creatinine equation that estimates kidney function   without a race variable.    eGFR  Date Value Ref Range Status  04/02/2022 85 >59 mL/min/1.73 Final   GFR calc non Af Amer  Date Value Ref Range Status  03/11/2020 75 >59 mL/min/1.73 Final

## 2022-05-25 DIAGNOSIS — Z170 Estrogen receptor positive status [ER+]: Secondary | ICD-10-CM | POA: Diagnosis not present

## 2022-05-25 DIAGNOSIS — N644 Mastodynia: Secondary | ICD-10-CM | POA: Diagnosis not present

## 2022-05-25 DIAGNOSIS — C50211 Malignant neoplasm of upper-inner quadrant of right female breast: Secondary | ICD-10-CM | POA: Diagnosis not present

## 2022-05-25 LAB — HM MAMMOGRAPHY

## 2022-05-28 DIAGNOSIS — G43909 Migraine, unspecified, not intractable, without status migrainosus: Secondary | ICD-10-CM | POA: Diagnosis not present

## 2022-05-28 DIAGNOSIS — F419 Anxiety disorder, unspecified: Secondary | ICD-10-CM | POA: Diagnosis not present

## 2022-05-28 DIAGNOSIS — Z79899 Other long term (current) drug therapy: Secondary | ICD-10-CM | POA: Diagnosis not present

## 2022-05-28 DIAGNOSIS — C50911 Malignant neoplasm of unspecified site of right female breast: Secondary | ICD-10-CM | POA: Diagnosis not present

## 2022-05-28 DIAGNOSIS — F259 Schizoaffective disorder, unspecified: Secondary | ICD-10-CM | POA: Diagnosis not present

## 2022-06-01 DIAGNOSIS — Z85828 Personal history of other malignant neoplasm of skin: Secondary | ICD-10-CM | POA: Diagnosis not present

## 2022-06-25 DIAGNOSIS — Z85828 Personal history of other malignant neoplasm of skin: Secondary | ICD-10-CM | POA: Diagnosis not present

## 2022-06-25 DIAGNOSIS — D2262 Melanocytic nevi of left upper limb, including shoulder: Secondary | ICD-10-CM | POA: Diagnosis not present

## 2022-06-25 DIAGNOSIS — D225 Melanocytic nevi of trunk: Secondary | ICD-10-CM | POA: Diagnosis not present

## 2022-06-25 DIAGNOSIS — D2261 Melanocytic nevi of right upper limb, including shoulder: Secondary | ICD-10-CM | POA: Diagnosis not present

## 2022-10-01 DIAGNOSIS — Z170 Estrogen receptor positive status [ER+]: Secondary | ICD-10-CM | POA: Diagnosis not present

## 2022-10-01 DIAGNOSIS — C50211 Malignant neoplasm of upper-inner quadrant of right female breast: Secondary | ICD-10-CM | POA: Diagnosis not present

## 2022-11-10 ENCOUNTER — Other Ambulatory Visit: Payer: Self-pay | Admitting: Internal Medicine

## 2022-11-10 DIAGNOSIS — F259 Schizoaffective disorder, unspecified: Secondary | ICD-10-CM

## 2022-11-26 DIAGNOSIS — F259 Schizoaffective disorder, unspecified: Secondary | ICD-10-CM | POA: Diagnosis not present

## 2022-11-26 DIAGNOSIS — Z170 Estrogen receptor positive status [ER+]: Secondary | ICD-10-CM | POA: Diagnosis not present

## 2022-11-26 DIAGNOSIS — C50911 Malignant neoplasm of unspecified site of right female breast: Secondary | ICD-10-CM | POA: Diagnosis not present

## 2022-11-26 DIAGNOSIS — F419 Anxiety disorder, unspecified: Secondary | ICD-10-CM | POA: Diagnosis not present

## 2022-11-26 DIAGNOSIS — E785 Hyperlipidemia, unspecified: Secondary | ICD-10-CM | POA: Diagnosis not present

## 2022-12-28 ENCOUNTER — Encounter: Payer: Self-pay | Admitting: Internal Medicine

## 2022-12-28 ENCOUNTER — Ambulatory Visit (INDEPENDENT_AMBULATORY_CARE_PROVIDER_SITE_OTHER): Payer: BC Managed Care – PPO | Admitting: Internal Medicine

## 2022-12-28 VITALS — BP 100/76 | HR 87 | Temp 98.0°F | Ht 69.0 in | Wt 204.0 lb

## 2022-12-28 DIAGNOSIS — J020 Streptococcal pharyngitis: Secondary | ICD-10-CM | POA: Diagnosis not present

## 2022-12-28 MED ORDER — AZITHROMYCIN 250 MG PO TABS: ORAL_TABLET | ORAL | 0 refills | Status: AC

## 2022-12-28 NOTE — Progress Notes (Signed)
Date:  12/28/2022   Name:  Beth Lewis   DOB:  Jul 22, 1970   MRN:  [id number]   Chief Complaint: Cough  Sore Throat  This is a new problem. Episode onset: past three days. The problem has been gradually worsening. Neither side of throat is experiencing more pain than the other. There has been no fever. The pain is moderate. Associated symptoms include congestion, ear pain, a plugged ear sensation and trouble swallowing. Pertinent negatives include no diarrhea, hoarse voice, shortness of breath or swollen glands. She has tried acetaminophen for the symptoms. The treatment provided mild relief.    Review of Systems  Constitutional:  Positive for fatigue. Negative for chills and fever.  HENT:  Positive for congestion, ear pain and trouble swallowing. Negative for hoarse voice.   Respiratory:  Negative for chest tightness, shortness of breath and wheezing.   Cardiovascular:  Negative for chest pain.  Gastrointestinal:  Negative for diarrhea.     Lab Results  Component Value Date   NA 136 04/02/2022   K 4.4 04/02/2022   CO2 22 04/02/2022   GLUCOSE 89 04/02/2022   BUN 14 04/02/2022   CREATININE 0.84 04/02/2022   CALCIUM 9.8 04/02/2022   EGFR 85 04/02/2022   GFRNONAA 75 03/11/2020   Lab Results  Component Value Date   CHOL 154 04/02/2022   HDL 42 04/02/2022   LDLCALC 82 04/02/2022   TRIG 178 (H) 04/02/2022   CHOLHDL 3.7 04/02/2022   Lab Results  Component Value Date   TSH 2.570 04/02/2022   Lab Results  Component Value Date   HGBA1C 5.4 04/02/2022   Lab Results  Component Value Date   WBC 7.0 04/02/2022   HGB 13.1 04/02/2022   HCT 39.6 04/02/2022   MCV 97 04/02/2022   PLT 298 04/02/2022   Lab Results  Component Value Date   ALT 32 04/02/2022   AST 23 04/02/2022   ALKPHOS 88 04/02/2022   BILITOT 0.3 04/02/2022   No results found for: "25OHVITD2", "25OHVITD3", "VD25OH"   Patient Active Problem List   Diagnosis Date Noted   Malignant neoplasm of  upper-inner quadrant of right breast in female, estrogen receptor positive (HCC) 10/01/2020   OAB (overactive bladder) 01/05/2018   Urge incontinence of urine 01/05/2018   Dyslipidemia 07/16/2014   Migraine without aura and responsive to treatment 07/16/2014   Chronic schizoaffective disorder (HCC) 07/16/2014    No Known Allergies  Past Surgical History:  Procedure Laterality Date   BASAL CELL CARCINOMA EXCISION     BREAST BIOPSY Right 09/30/2020   u/s bx-"ribbon" clip-path pending   COLONOSCOPY WITH PROPOFOL N/A 09/26/2021   Procedure: COLONOSCOPY WITH PROPOFOL;  Surgeon: Toney Reil, MD;  Location: [facility] ENDOSCOPY;  Service: Gastroenterology;  Laterality: N/A;   MASTECTOMY, PARTIAL Right 10/2020   MOHS SURGERY  2012    Social History   Tobacco Use   Smoking status: Never   Smokeless tobacco: Never  Vaping Use   Vaping status: Never Used  Substance Use Topics   Alcohol use: Yes    Alcohol/week: 0.0 standard drinks of alcohol    Comment: occasional   Drug use: No     Medication list has been reviewed and updated.  Current Meds  Medication Sig   azithromycin (ZITHROMAX Z-PAK) 250 MG tablet UAD   divalproex (DEPAKOTE) 500 MG DR tablet Take 1 tablet (500 mg total) by mouth 3 (three) times daily.   oxybutynin (DITROPAN-XL) 5 MG 24 hr tablet Take 1  tablet (5 mg total) by mouth daily.   risperiDONE (RISPERDAL) 1 MG tablet TAKE 1 TABLET AT BEDTIME   tamoxifen (NOLVADEX) 20 MG tablet Take 20 mg by mouth daily.       12/28/2022    3:50 PM 04/02/2022   10:09 AM 04/16/2021    1:55 PM 04/09/2021   11:14 AM  GAD 7 : Generalized Anxiety Score  Nervous, Anxious, on Edge 0 0 0 0  Control/stop worrying 0 0 0 0  Worry too much - different things 0 0 0 0  Trouble relaxing 0 0 0 0  Restless 0 0 0 0  Easily annoyed or irritable 0 0 0 0  Afraid - awful might happen 0 0 0 0  Total GAD 7 Score 0 0 0 0  Anxiety Difficulty Not difficult at all Not difficult at all Not difficult  at all Not difficult at all       12/28/2022    3:50 PM 04/02/2022   10:09 AM 04/16/2021    1:55 PM  Depression screen PHQ 2/9  Decreased Interest 0 0 0  Down, Depressed, Hopeless 0 0 0  PHQ - 2 Score 0 0 0  Altered sleeping 0 0 0  Tired, decreased energy 2 1 0  Change in appetite 1 0 0  Feeling bad or failure about yourself  0 0 0  Trouble concentrating 0 0 0  Moving slowly or fidgety/restless 0 0 0  Suicidal thoughts 0 0 0  PHQ-9 Score 3 1 0  Difficult doing work/chores Not difficult at all Not difficult at all Not difficult at all    BP Readings from Last 3 Encounters:  12/28/22 100/76  04/02/22 106/72  09/26/21 102/63    Physical Exam Constitutional:      Appearance: Normal appearance. She is well-developed. She is not ill-appearing.  HENT:     Right Ear: Ear canal and external ear normal. Tympanic membrane is retracted. Tympanic membrane is not erythematous.     Left Ear: Ear canal and external ear normal. Tympanic membrane is retracted. Tympanic membrane is not erythematous.     Nose:     Right Sinus: Maxillary sinus tenderness present. No frontal sinus tenderness.     Left Sinus: Maxillary sinus tenderness present. No frontal sinus tenderness.     Mouth/Throat:     Mouth: No oral lesions.     Pharynx: Uvula midline. Posterior oropharyngeal erythema present. No oropharyngeal exudate.  Cardiovascular:     Rate and Rhythm: Normal rate and regular rhythm.     Heart sounds: Normal heart sounds.  Pulmonary:     Breath sounds: Normal breath sounds. No wheezing, rhonchi or rales.  Lymphadenopathy:     Cervical: No cervical adenopathy.  Neurological:     Mental Status: She is alert and oriented to person, place, and time.     Wt Readings from Last 3 Encounters:  12/28/22 204 lb (92.5 kg)  04/02/22 214 lb (97.1 kg)  09/26/21 200 lb (90.7 kg)    BP 100/76   Pulse 87   Temp 98 F (36.7 C) (Oral)   Ht 5\' 9"  (1.753 m)   Wt 204 lb (92.5 kg)   SpO2 97%   BMI  30.13 kg/m   Assessment and Plan:  Problem List Items Addressed This Visit   None Visit Diagnoses     Pharyngitis due to Streptococcus species    -  Primary   push fluids; take Tylenol or Advil regularly for discomfort follow  up if no improvement   Relevant Medications   azithromycin (ZITHROMAX Z-PAK) 250 MG tablet       No follow-ups on file.    Reubin Milan, MD [facility] Medicine [place]

## 2022-12-28 NOTE — Patient Instructions (Signed)
Continue fluids and rest  Over the counter cough syrup like Delsym can help  Try a decongestant like Coricidin if needed

## 2023-04-05 ENCOUNTER — Ambulatory Visit (INDEPENDENT_AMBULATORY_CARE_PROVIDER_SITE_OTHER): Payer: BC Managed Care – PPO | Admitting: Internal Medicine

## 2023-04-05 ENCOUNTER — Encounter: Payer: Self-pay | Admitting: Internal Medicine

## 2023-04-05 VITALS — BP 108/76 | HR 78 | Ht 69.0 in | Wt 211.0 lb

## 2023-04-05 DIAGNOSIS — Z1231 Encounter for screening mammogram for malignant neoplasm of breast: Secondary | ICD-10-CM

## 2023-04-05 DIAGNOSIS — E785 Hyperlipidemia, unspecified: Secondary | ICD-10-CM | POA: Diagnosis not present

## 2023-04-05 DIAGNOSIS — F259 Schizoaffective disorder, unspecified: Secondary | ICD-10-CM

## 2023-04-05 DIAGNOSIS — Z0000 Encounter for general adult medical examination without abnormal findings: Secondary | ICD-10-CM

## 2023-04-05 DIAGNOSIS — Z170 Estrogen receptor positive status [ER+]: Secondary | ICD-10-CM

## 2023-04-05 DIAGNOSIS — Z131 Encounter for screening for diabetes mellitus: Secondary | ICD-10-CM | POA: Diagnosis not present

## 2023-04-05 DIAGNOSIS — C50211 Malignant neoplasm of upper-inner quadrant of right female breast: Secondary | ICD-10-CM | POA: Diagnosis not present

## 2023-04-05 DIAGNOSIS — N3281 Overactive bladder: Secondary | ICD-10-CM

## 2023-04-05 MED ORDER — DIVALPROEX SODIUM 500 MG PO DR TAB: 500.0000 mg | DELAYED_RELEASE_TABLET | Freq: Three times a day (TID) | ORAL | 3 refills | Status: AC

## 2023-04-05 MED ORDER — OXYBUTYNIN CHLORIDE ER 5 MG PO TB24: 5.0000 mg | ORAL_TABLET | Freq: Every day | ORAL | 3 refills | Status: AC

## 2023-04-05 NOTE — Assessment & Plan Note (Signed)
 Doing well on current medication regimen - has been stable for many years. Does not require Psych care at this time.

## 2023-04-05 NOTE — Assessment & Plan Note (Signed)
 Excellent response to treatment. Will continue Ditropan

## 2023-04-05 NOTE — Assessment & Plan Note (Addendum)
 Being followed by [facility] Oncology On Tamoxifen.  Has fullness/scar tissue in the right breast which she thinks may be new. Mammo due in May but recommend she contact Oncology for advice

## 2023-04-05 NOTE — Progress Notes (Signed)
 Date:  04/05/2023   Name:  Beth Lewis   DOB:  [birth year]   MRN:  [id number]   Chief Complaint: Annual Exam DI JASMER is a 53 y.o. female who presents today for her Complete Annual Exam. She feels well. She reports exercising none. She reports she is sleeping fairly well. Breast complaints a new lump patient has noticed on right breast.  Mammograms being done at [facility].  Health Maintenance  Topic Date Due   Zoster (Shingles) Vaccine (1 of 2) Never done   COVID-19 Vaccine (3 - Pfizer risk series) 04/21/2023*   Mammogram  05/25/2023   DTaP/Tdap/Td vaccine (2 - Td or Tdap) 02/19/2026   Pap with HPV screening  12/24/2026   Colon Cancer Screening  09/27/2031   Flu Shot  Completed   Hepatitis C Screening  Completed   HIV Screening  Completed   HPV Vaccine  Aged Out  *Topic was postponed. The date shown is not the original due date.    HPI  Review of Systems  Constitutional:  Negative for fatigue and unexpected weight change.  HENT:  Negative for trouble swallowing.   Eyes:  Negative for visual disturbance.  Respiratory:  Negative for cough, chest tightness, shortness of breath and wheezing.   Cardiovascular:  Negative for chest pain, palpitations and leg swelling.  Gastrointestinal:  Negative for abdominal pain, constipation and diarrhea.  Genitourinary:        Possible mass in right breast for past month  Musculoskeletal:  Negative for arthralgias and myalgias.  Neurological:  Negative for dizziness, weakness, light-headedness and headaches.     Lab Results  Component Value Date   NA 136 04/02/2022   K 4.4 04/02/2022   CO2 22 04/02/2022   GLUCOSE 89 04/02/2022   BUN 14 04/02/2022   CREATININE 0.84 04/02/2022   CALCIUM 9.8 04/02/2022   EGFR 85 04/02/2022   GFRNONAA 75 03/11/2020   Lab Results  Component Value Date   CHOL 154 04/02/2022   HDL 42 04/02/2022   LDLCALC 82 04/02/2022   TRIG 178 (H) 04/02/2022   CHOLHDL 3.7 04/02/2022   Lab Results   Component Value Date   TSH 2.570 04/02/2022   Lab Results  Component Value Date   HGBA1C 5.4 04/02/2022   Lab Results  Component Value Date   WBC 7.0 04/02/2022   HGB 13.1 04/02/2022   HCT 39.6 04/02/2022   MCV 97 04/02/2022   PLT 298 04/02/2022   Lab Results  Component Value Date   ALT 32 04/02/2022   AST 23 04/02/2022   ALKPHOS 88 04/02/2022   BILITOT 0.3 04/02/2022   No results found for: "25OHVITD2", "25OHVITD3", "VD25OH"   Patient Active Problem List   Diagnosis Date Noted   Malignant neoplasm of upper-inner quadrant of right breast in female, estrogen receptor positive (HCC) 10/01/2020   OAB (overactive bladder) 01/05/2018   Dyslipidemia 07/16/2014   Migraine without aura and responsive to treatment 07/16/2014   Chronic schizoaffective disorder (HCC) 07/16/2014    No Known Allergies  Past Surgical History:  Procedure Laterality Date   BASAL CELL CARCINOMA EXCISION     BREAST BIOPSY Right 09/30/2020   u/s bx-"ribbon" clip-path pending   COLONOSCOPY WITH PROPOFOL N/A 09/26/2021   Procedure: COLONOSCOPY WITH PROPOFOL;  Surgeon: Toney Reil, MD;  Location: [facility] ENDOSCOPY;  Service: Gastroenterology;  Laterality: N/A;   MASTECTOMY, PARTIAL Right 10/2020   MOHS SURGERY  2012    Social History   Tobacco Use  Smoking status: Never   Smokeless tobacco: Never  Vaping Use   Vaping status: Never Used  Substance Use Topics   Alcohol use: Yes    Alcohol/week: 0.0 standard drinks of alcohol    Comment: occasional   Drug use: No     Medication list has been reviewed and updated.  Current Meds  Medication Sig   risperiDONE (RISPERDAL) 1 MG tablet TAKE 1 TABLET AT BEDTIME   tamoxifen (NOLVADEX) 20 MG tablet Take 20 mg by mouth daily.   [DISCONTINUED] divalproex (DEPAKOTE) 500 MG DR tablet Take 1 tablet (500 mg total) by mouth 3 (three) times daily.   [DISCONTINUED] oxybutynin (DITROPAN-XL) 5 MG 24 hr tablet Take 1 tablet (5 mg total) by mouth daily.        04/05/2023    8:12 AM 12/28/2022    3:50 PM 04/02/2022   10:09 AM 04/16/2021    1:55 PM  GAD 7 : Generalized Anxiety Score  Nervous, Anxious, on Edge 0 0 0 0  Control/stop worrying 0 0 0 0  Worry too much - different things 0 0 0 0  Trouble relaxing 0 0 0 0  Restless 0 0 0 0  Easily annoyed or irritable 0 0 0 0  Afraid - awful might happen 0 0 0 0  Total GAD 7 Score 0 0 0 0  Anxiety Difficulty Not difficult at all Not difficult at all Not difficult at all Not difficult at all       04/05/2023    8:12 AM 12/28/2022    3:50 PM 04/02/2022   10:09 AM  Depression screen PHQ 2/9  Decreased Interest 0 0 0  Down, Depressed, Hopeless 0 0 0  PHQ - 2 Score 0 0 0  Altered sleeping 0 0 0  Tired, decreased energy 3 2 1   Change in appetite 0 1 0  Feeling bad or failure about yourself  0 0 0  Trouble concentrating 0 0 0  Moving slowly or fidgety/restless 0 0 0  Suicidal thoughts 0 0 0  PHQ-9 Score 3 3 1   Difficult doing work/chores Not difficult at all Not difficult at all Not difficult at all    BP Readings from Last 3 Encounters:  04/05/23 108/76  12/28/22 100/76  04/02/22 106/72    Physical Exam Vitals and nursing note reviewed.  Constitutional:      General: She is not in acute distress.    Appearance: She is well-developed.  HENT:     Head: Normocephalic and atraumatic.     Right Ear: Tympanic membrane and ear canal normal.     Left Ear: Tympanic membrane and ear canal normal.     Nose:     Right Sinus: No maxillary sinus tenderness.     Left Sinus: No maxillary sinus tenderness.  Eyes:     General: No scleral icterus.       Right eye: No discharge.        Left eye: No discharge.     Conjunctiva/sclera: Conjunctivae normal.  Neck:     Thyroid: No thyromegaly.     Vascular: No carotid bruit.  Cardiovascular:     Rate and Rhythm: Normal rate and regular rhythm.     Pulses: Normal pulses.     Heart sounds: Normal heart sounds.  Pulmonary:     Effort: Pulmonary  effort is normal. No respiratory distress.     Breath sounds: No wheezing.  Chest:       Comments: Firm area  with minimal tenderness Abdominal:     General: Bowel sounds are normal.     Palpations: Abdomen is soft.     Tenderness: There is no abdominal tenderness.  Musculoskeletal:     Cervical back: Normal range of motion. No erythema.     Right lower leg: No edema.     Left lower leg: No edema.  Lymphadenopathy:     Cervical: No cervical adenopathy.  Skin:    General: Skin is warm and dry.     Capillary Refill: Capillary refill takes less than 2 seconds.     Findings: No rash.  Neurological:     Mental Status: She is alert and oriented to person, place, and time.     Cranial Nerves: No cranial nerve deficit.     Sensory: No sensory deficit.     Deep Tendon Reflexes: Reflexes are normal and symmetric.  Psychiatric:        Attention and Perception: Attention and perception normal.        Mood and Affect: Mood normal.        Thought Content: Thought content normal.        Cognition and Memory: Cognition normal.     Wt Readings from Last 3 Encounters:  04/05/23 211 lb (95.7 kg)  12/28/22 204 lb (92.5 kg)  04/02/22 214 lb (97.1 kg)    BP 108/76   Pulse 78   Ht 5\' 9"  (1.753 m)   Wt 211 lb (95.7 kg)   SpO2 97%   BMI 31.16 kg/m   Assessment and Plan:  Problem List Items Addressed This Visit       Unprioritized   Dyslipidemia   Relevant Orders   Lipid panel   TSH   Chronic schizoaffective disorder (HCC)   Doing well on current medication regimen - has been stable for many years. Does not require Psych care at this time.      Relevant Medications   divalproex (DEPAKOTE) 500 MG DR tablet   OAB (overactive bladder)   Excellent response to treatment. Will continue Ditropan      Relevant Medications   oxybutynin (DITROPAN-XL) 5 MG 24 hr tablet   Malignant neoplasm of upper-inner quadrant of right breast in female, estrogen receptor positive (HCC)   Being  followed by [facility] On Tamoxifen.  Has fullness/scar tissue in the right breast which she thinks may be new. Mammo due in May but recommend she contact Oncology for advice      Relevant Orders   CBC with Differential/Platelet   Other Visit Diagnoses       Annual physical exam    -  Primary   continue work on diet and exercise screenings and immunizations up to date   Relevant Orders   CBC with Differential/Platelet   Comprehensive metabolic panel   Hemoglobin A1c   Lipid panel   TSH     Encounter for screening mammogram for breast cancer         Screening for diabetes mellitus       Relevant Orders   Comprehensive metabolic panel   Hemoglobin A1c       Return in about 1 year (around 04/04/2024) for CPX with Dr. Elaina Pattee.    Reubin Milan, MD [facility] [place]

## 2023-04-06 ENCOUNTER — Encounter: Payer: Self-pay | Admitting: Internal Medicine

## 2023-04-06 LAB — CBC WITH DIFFERENTIAL/PLATELET
Basophils Absolute: 0.1 10*3/uL (ref 0.0–0.2)
Basos: 1 %
EOS (ABSOLUTE): 0.2 10*3/uL (ref 0.0–0.4)
Eos: 2 %
Hematocrit: 40.7 % (ref 34.0–46.6)
Hemoglobin: 13.6 g/dL (ref 11.1–15.9)
Immature Grans (Abs): 0 10*3/uL (ref 0.0–0.1)
Immature Granulocytes: 0 %
Lymphocytes Absolute: 2.8 10*3/uL (ref 0.7–3.1)
Lymphs: 37 %
MCH: 31.9 pg (ref 26.6–33.0)
MCHC: 33.4 g/dL (ref 31.5–35.7)
MCV: 96 fL (ref 79–97)
Monocytes Absolute: 0.5 10*3/uL (ref 0.1–0.9)
Monocytes: 7 %
Neutrophils Absolute: 3.9 10*3/uL (ref 1.4–7.0)
Neutrophils: 53 %
Platelets: 307 10*3/uL (ref 150–450)
RBC: 4.26 x10E6/uL (ref 3.77–5.28)
RDW: 12.4 % (ref 11.7–15.4)
WBC: 7.5 10*3/uL (ref 3.4–10.8)

## 2023-04-06 LAB — COMPREHENSIVE METABOLIC PANEL
ALT: 24 IU/L (ref 0–32)
AST: 17 IU/L (ref 0–40)
Albumin: 4.2 g/dL (ref 3.8–4.9)
Alkaline Phosphatase: 96 IU/L (ref 44–121)
BUN/Creatinine Ratio: 15 (ref 9–23)
BUN: 13 mg/dL (ref 6–24)
Bilirubin Total: 0.3 mg/dL (ref 0.0–1.2)
CO2: 24 mmol/L (ref 20–29)
Calcium: 10.2 mg/dL (ref 8.7–10.2)
Chloride: 105 mmol/L (ref 96–106)
Creatinine, Ser: 0.87 mg/dL (ref 0.57–1.00)
Globulin, Total: 2.6 g/dL (ref 1.5–4.5)
Glucose: 91 mg/dL (ref 70–99)
Potassium: 4.9 mmol/L (ref 3.5–5.2)
Sodium: 141 mmol/L (ref 134–144)
Total Protein: 6.8 g/dL (ref 6.0–8.5)
eGFR: 81 mL/min/{1.73_m2} (ref >59)

## 2023-04-06 LAB — LIPID PANEL
Chol/HDL Ratio: 3.1 ratio (ref 0.0–4.4)
Cholesterol, Total: 161 mg/dL (ref 100–199)
HDL: 52 mg/dL (ref >39)
LDL Chol Calc (NIH): 85 mg/dL (ref 0–99)
Triglycerides: 137 mg/dL (ref 0–149)
VLDL Cholesterol Cal: 24 mg/dL (ref 5–40)

## 2023-04-06 LAB — HEMOGLOBIN A1C
Est. average glucose Bld gHb Est-mCnc: 103 mg/dL
Hgb A1c MFr Bld: 5.2 % (ref 4.8–5.6)

## 2023-04-06 LAB — TSH: TSH: 4.3 u[IU]/mL (ref 0.450–4.500)

## 2023-04-13 DIAGNOSIS — N6310 Unspecified lump in the right breast, unspecified quadrant: Secondary | ICD-10-CM | POA: Diagnosis not present

## 2023-04-13 DIAGNOSIS — Z853 Personal history of malignant neoplasm of breast: Secondary | ICD-10-CM | POA: Diagnosis not present

## 2023-04-13 DIAGNOSIS — C50211 Malignant neoplasm of upper-inner quadrant of right female breast: Secondary | ICD-10-CM | POA: Diagnosis not present

## 2023-04-13 DIAGNOSIS — Z08 Encounter for follow-up examination after completed treatment for malignant neoplasm: Secondary | ICD-10-CM | POA: Diagnosis not present

## 2023-04-13 DIAGNOSIS — Z170 Estrogen receptor positive status [ER+]: Secondary | ICD-10-CM | POA: Diagnosis not present

## 2023-04-14 DIAGNOSIS — F259 Schizoaffective disorder, unspecified: Secondary | ICD-10-CM | POA: Diagnosis not present

## 2023-04-14 DIAGNOSIS — Z923 Personal history of irradiation: Secondary | ICD-10-CM | POA: Diagnosis not present

## 2023-04-14 DIAGNOSIS — F419 Anxiety disorder, unspecified: Secondary | ICD-10-CM | POA: Diagnosis not present

## 2023-04-14 DIAGNOSIS — Z9011 Acquired absence of right breast and nipple: Secondary | ICD-10-CM | POA: Diagnosis not present

## 2023-04-14 DIAGNOSIS — Z08 Encounter for follow-up examination after completed treatment for malignant neoplasm: Secondary | ICD-10-CM | POA: Diagnosis not present

## 2023-04-14 DIAGNOSIS — C50211 Malignant neoplasm of upper-inner quadrant of right female breast: Secondary | ICD-10-CM | POA: Diagnosis not present

## 2023-04-14 DIAGNOSIS — Z853 Personal history of malignant neoplasm of breast: Secondary | ICD-10-CM | POA: Diagnosis not present

## 2023-04-14 DIAGNOSIS — R234 Changes in skin texture: Secondary | ICD-10-CM | POA: Diagnosis not present

## 2023-04-14 DIAGNOSIS — Z79810 Long term (current) use of selective estrogen receptor modulators (SERMs): Secondary | ICD-10-CM | POA: Diagnosis not present

## 2023-04-14 DIAGNOSIS — R238 Other skin changes: Secondary | ICD-10-CM | POA: Diagnosis not present

## 2023-04-14 DIAGNOSIS — Z79899 Other long term (current) drug therapy: Secondary | ICD-10-CM | POA: Diagnosis not present

## 2023-04-14 DIAGNOSIS — Z170 Estrogen receptor positive status [ER+]: Secondary | ICD-10-CM | POA: Diagnosis not present

## 2023-07-01 DIAGNOSIS — D225 Melanocytic nevi of trunk: Secondary | ICD-10-CM | POA: Diagnosis not present

## 2023-07-01 DIAGNOSIS — D2272 Melanocytic nevi of left lower limb, including hip: Secondary | ICD-10-CM | POA: Diagnosis not present

## 2023-07-01 DIAGNOSIS — D2261 Melanocytic nevi of right upper limb, including shoulder: Secondary | ICD-10-CM | POA: Diagnosis not present

## 2023-07-01 DIAGNOSIS — D2262 Melanocytic nevi of left upper limb, including shoulder: Secondary | ICD-10-CM | POA: Diagnosis not present

## 2023-07-01 DIAGNOSIS — C44311 Basal cell carcinoma of skin of nose: Secondary | ICD-10-CM | POA: Diagnosis not present

## 2023-07-01 DIAGNOSIS — D485 Neoplasm of uncertain behavior of skin: Secondary | ICD-10-CM | POA: Diagnosis not present

## 2023-11-03 DIAGNOSIS — C44311 Basal cell carcinoma of skin of nose: Secondary | ICD-10-CM | POA: Diagnosis not present

## 2023-11-03 DIAGNOSIS — L578 Other skin changes due to chronic exposure to nonionizing radiation: Secondary | ICD-10-CM | POA: Diagnosis not present

## 2023-11-03 DIAGNOSIS — L814 Other melanin hyperpigmentation: Secondary | ICD-10-CM | POA: Diagnosis not present

## 2023-11-05 ENCOUNTER — Other Ambulatory Visit: Payer: Self-pay | Admitting: Internal Medicine

## 2023-11-05 DIAGNOSIS — F259 Schizoaffective disorder, unspecified: Secondary | ICD-10-CM

## 2023-11-08 ENCOUNTER — Other Ambulatory Visit: Payer: Self-pay | Admitting: Internal Medicine

## 2023-11-08 DIAGNOSIS — N3281 Overactive bladder: Secondary | ICD-10-CM

## 2023-11-08 NOTE — Telephone Encounter (Signed)
 Requested medication (s) are due for refill today: yes  Requested medication (s) are on the active medication list: yes  Last refill:  11/10/22  Future visit scheduled: yes  Notes to clinic:  Unable to refill per protocol, cannot delegate.      Requested Prescriptions  Pending Prescriptions Disp Refills   risperiDONE  (RISPERDAL ) 1 MG tablet [Pharmacy Med Name: RISPERIDONE  TABS 1MG ] 90 tablet 3    Sig: TAKE 1 TABLET AT BEDTIME     Not Delegated - Psychiatry:  Antipsychotics - Second Generation (Atypical) - risperidone  Failed - 11/08/2023  9:16 AM      Failed - This refill cannot be delegated      Failed - Valid encounter within last 6 months    Recent Outpatient Visits           7 months ago Annual physical exam   [facility] at [facility], Leita DEL, MD       Future Appointments             In 5 months Lemon Raisin, MD [facility] at [facility], [phone] [place]            Failed - Lipid Panel in normal range within the last 12 months    Cholesterol, Total  Date Value Ref Range Status  04/05/2023 161 100 - 199 mg/dL Final   LDL Chol Calc (NIH)  Date Value Ref Range Status  04/05/2023 85 0 - 99 mg/dL Final   HDL  Date Value Ref Range Status  04/05/2023 52 >39 mg/dL Final   Triglycerides  Date Value Ref Range Status  04/05/2023 137 0 - 149 mg/dL Final         Passed - TSH in normal range and within 360 days    TSH  Date Value Ref Range Status  04/05/2023 4.300 0.450 - 4.500 uIU/mL Final         Passed - Completed PHQ-2 or PHQ-9 in the last 360 days      Passed - Last BP in normal range    BP Readings from Last 1 Encounters:  04/05/23 108/76         Passed - Last Heart Rate in normal range    Pulse Readings from Last 1 Encounters:  04/05/23 78         Passed - CBC within normal limits and completed in the last 12 months    WBC  Date Value Ref Range Status   04/05/2023 7.5 3.4 - 10.8 x10E3/uL Final   RBC  Date Value Ref Range Status  04/05/2023 4.26 3.77 - 5.28 x10E6/uL Final  10/29/2020 0 (A) 4.04 - 5.48 M/uL Final   Hemoglobin  Date Value Ref Range Status  04/05/2023 13.6 11.1 - 15.9 g/dL Final   Hematocrit  Date Value Ref Range Status  04/05/2023 40.7 34.0 - 46.6 % Final   MCHC  Date Value Ref Range Status  04/05/2023 33.4 31.5 - 35.7 g/dL Final   [facility]  Date Value Ref Range Status  04/05/2023 31.9 26.6 - 33.0 pg Final   MCV  Date Value Ref Range Status  04/05/2023 96 79 - 97 fL Final   No results found for: PLTCOUNTKUC, LABPLAT, POCPLA RDW  Date Value Ref Range Status  04/05/2023 12.4 11.7 - 15.4 % Final         Passed - CMP within normal limits and completed in the last 12 months  Albumin  Date Value Ref Range Status  04/05/2023 4.2 3.8 - 4.9 g/dL Final   Alkaline Phosphatase  Date Value Ref Range Status  04/05/2023 96 44 - 121 IU/L Final   ALT  Date Value Ref Range Status  04/05/2023 24 0 - 32 IU/L Final   AST  Date Value Ref Range Status  04/05/2023 17 0 - 40 IU/L Final   BUN  Date Value Ref Range Status  04/05/2023 13 6 - 24 mg/dL Final   Calcium  Date Value Ref Range Status  04/05/2023 10.2 8.7 - 10.2 mg/dL Final   CO2  Date Value Ref Range Status  04/05/2023 24 20 - 29 mmol/L Final   Creatinine, Ser  Date Value Ref Range Status  04/05/2023 0.87 0.57 - 1.00 mg/dL Final   Glucose  Date Value Ref Range Status  04/05/2023 91 70 - 99 mg/dL Final   Potassium  Date Value Ref Range Status  04/05/2023 4.9 3.5 - 5.2 mmol/L Final   Sodium  Date Value Ref Range Status  04/05/2023 141 134 - 144 mmol/L Final   Bilirubin Total  Date Value Ref Range Status  04/05/2023 0.3 0.0 - 1.2 mg/dL Final   Protein, ur  Date Value Ref Range Status  06/10/2016 NEGATIVE NEGATIVE mg/dL Final   Protein, UA  Date Value Ref Range Status  10/29/2020 Negative Negative Final   Total Protein  Date  Value Ref Range Status  04/05/2023 6.8 6.0 - 8.5 g/dL Final   GFR calc Af Amer  Date Value Ref Range Status  03/11/2020 86 >59 mL/min/1.73 Final    Comment:    **In accordance with recommendations from the NKF-ASN Task force,**   [facility] is in the process of updating its eGFR calculation to the   2021 CKD-EPI creatinine equation that estimates kidney function   without a race variable.    eGFR  Date Value Ref Range Status  04/05/2023 81 >59 mL/min/1.73 Final   GFR calc non Af Amer  Date Value Ref Range Status  03/11/2020 75 >59 mL/min/1.73 Final

## 2023-11-08 NOTE — Telephone Encounter (Signed)
 Copied from CRM #[id number]. Topic: Clinical - Medication Refill >> Nov 08, 2023  4:21 PM Beth Lewis wrote: Medication: oxybutynin  (DITROPAN -XL) 5 MG 24 hr tablet  Has the patient contacted their pharmacy? yes advise?)contact pcp  This is the patient's preferred pharmacy:   [facility] - [place], [place] - [address] [place] [address] Phone: [phone] Fax: [phone]    Is this the correct pharmacy for this prescription? yes    Has the prescription been filled recently? no  Is the patient out of the medication? no  Has the patient been seen for an appointment in the last year OR does the patient have an upcoming appointment? yes  Can we respond through MyChart? yes  Agent: Please be advised that Rx refills may take up to 3 business days. We ask that you follow-up with your pharmacy.

## 2023-11-10 NOTE — Telephone Encounter (Signed)
 Too soon for refill, LRF 04/05/23 for 90 and 3 RF  Requested Prescriptions  Pending Prescriptions Disp Refills   oxybutynin  (DITROPAN -XL) 5 MG 24 hr tablet 90 tablet 3    Sig: Take 1 tablet (5 mg total) by mouth daily.     Urology:  Bladder Agents Passed - 11/10/2023  2:32 PM      Passed - Valid encounter within last 12 months    Recent Outpatient Visits           7 months ago Annual physical exam   [facility] at [facility], Leita DEL, MD       Future Appointments             In 4 months Lemon Raisin, MD [facility] Care & Sports Medicine at [facility], [phone] [place]

## 2024-02-03 ENCOUNTER — Other Ambulatory Visit: Payer: Self-pay

## 2024-02-03 DIAGNOSIS — F259 Schizoaffective disorder, unspecified: Secondary | ICD-10-CM

## 2024-02-03 MED ORDER — RISPERIDONE 1 MG PO TABS: 1.0000 mg | ORAL_TABLET | Freq: Every day | ORAL | 0 refills | Status: AC

## 2024-04-06 ENCOUNTER — Encounter: Payer: Self-pay | Admitting: Student
